# Patient Record
Sex: Female | Born: 1990 | Race: White | Hispanic: No | Marital: Single | Smoking: Never smoker
Health system: Southern US, Community
[De-identification: ages and names within clinical notes are randomized; demographics above are authoritative.]

## PROBLEM LIST (undated history)

## (undated) DIAGNOSIS — F88 Other disorders of psychological development: Secondary | ICD-10-CM

## (undated) DIAGNOSIS — F840 Autistic disorder: Secondary | ICD-10-CM

## (undated) DIAGNOSIS — Q909 Down syndrome, unspecified: Secondary | ICD-10-CM

## (undated) DIAGNOSIS — R482 Apraxia: Secondary | ICD-10-CM

## (undated) HISTORY — PX: MOUTH SURGERY: SHX715

---
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
## 2014-11-30 ENCOUNTER — Emergency Department (HOSPITAL_COMMUNITY)
Admission: EM | Admit: 2014-11-30 | Discharge: 2014-11-30 | Discharge status: Against Medical Advice | Payer: Medicaid Other

## 2014-11-30 ENCOUNTER — Encounter (HOSPITAL_COMMUNITY): Payer: Self-pay | Admitting: Emergency Medicine

## 2014-11-30 ENCOUNTER — Emergency Department (HOSPITAL_COMMUNITY)
Admission: EM | Admit: 2014-11-30 | Discharge: 2014-11-30 | Disposition: A | Discharge status: Nursing Home (Includes ICF) | Payer: Medicaid Other | Source: Home / Self Care

## 2014-11-30 DIAGNOSIS — R51 Headache: Secondary | ICD-10-CM

## 2014-11-30 DIAGNOSIS — R519 Headache, unspecified: Secondary | ICD-10-CM

## 2014-11-30 HISTORY — DX: Down syndrome, unspecified: Q90.9

## 2014-11-30 HISTORY — DX: Other disorders of psychological development: F88

## 2014-11-30 HISTORY — DX: Autistic disorder: F84.0

## 2014-11-30 HISTORY — DX: Apraxia: R48.2

## 2014-11-30 NOTE — ED Notes (Signed)
Mother of Pt to desk. Does not desire to wait. States that she will follow up with PCP. Pt sitting in waiting room, NAD, ambulatory

## 2014-11-30 NOTE — ED Notes (Signed)
Pt has Down's syndrome.  Her mother is with her today and states that for the last 2-3 weeks the pt has episodes of grabbing her head and covering her eyes and screaming, sometimes saying, "Ow."  Last night she was pointing at her head and saying, "Doctor, doctor".  The mother reports a loss of appetite and a withdrawal from her usual activities.  She is also reporting red bumps on her groin area that are opening with pus that dry up and reappear.  Mother denies any fevers. There is also a small red bump on her right eyelid.

## 2014-11-30 NOTE — ED Provider Notes (Signed)
CSN: [id number]     Arrival date & time 11/30/14  1445 History   None    Chief Complaint  Patient presents with  . Headache   (Consider location/radiation/quality/duration/timing/severity/associated sxs/prior Treatment)  HPI   Patient has been complaining of a HA for 2-3 weeks.  No history of migraines or headaches. Mom states it has increased in severity to the point where she screams and cries and points to her head and states "doctor".  Mom states this worries her as she does not like to go to the doctor and does not typically complain of pain.  Mom denies recent injury or fever.  Patient is unable to verbalize nausea, but mom denies vomiting. Reports patient has said that her stomach hurts.  She appears to be photophobic.  She is on oral birth control, but has been for "years" so no recent changes in hormonal therapy. Last dose of tylenol at 1400.  Past Medical History  Diagnosis Date  . Autism   . Down's syndrome   . Apraxia of speech   . Sensory integration disorder    Past Surgical History  Procedure Laterality Date  . Mouth surgery     History reviewed. No pertinent family history. History  Substance Use Topics  . Smoking status: Never Smoker   . Smokeless tobacco: Not on file  . Alcohol Use: No   OB History    No data available     Review of Systems  Constitutional: Negative.  Negative for fever and chills.  HENT: Negative for congestion, sinus pressure, sneezing and sore throat.   Eyes: Negative.   Respiratory: Negative.  Negative for cough, shortness of breath and wheezing.   Cardiovascular: Negative.   Gastrointestinal: Positive for abdominal pain. Negative for vomiting and diarrhea.       See HPI  Endocrine: Negative.   Genitourinary: Negative.   Skin: Positive for rash. Negative for pallor.       "bump on eyelid and vagina"  Allergic/Immunologic: Negative.  Negative for food allergies.  Neurological: Positive for headaches. Negative for speech difficulty  and weakness.  Hematological: Negative.   Psychiatric/Behavioral: Negative.     Allergies  Review of patient's allergies indicates no known allergies.  Home Medications   Prior to Admission medications   Medication Sig Start Date End Date Taking? Authorizing Provider  levonorgestrel-ethinyl estradiol (JOLESSA) 0.15-0.03 MG tablet Take 1 tablet by mouth daily.   Yes Historical Provider, MD   BP 123/77 mmHg  Pulse 96  Temp(Src) 98.6 F (37 C) (Oral)  Resp 16  SpO2 98%  LMP 10/30/2014 (Approximate)   Physical Exam  Constitutional: She appears well-developed and well-nourished. No distress.  HENT:  Head: Atraumatic.  Eyes: Conjunctivae are normal. Pupils are equal, round, and reactive to light. Right eye exhibits no discharge. Left eye exhibits no discharge. No scleral icterus.  Neck: Normal range of motion. Neck supple.  Cardiovascular: Normal rate, regular rhythm, normal heart sounds and intact distal pulses.  Exam reveals no gallop and no friction rub.   Pulmonary/Chest: Effort normal and breath sounds normal. No respiratory distress. She has no wheezes. She has no rales. She exhibits no tenderness.  Neurological: She is alert.  Unable to follow commands for neuro examination.  Skin: Skin is warm and dry. She is not diaphoretic.  Has a pustule on R eyelid. Few scattered pustules on mons pubis.   Nursing note and vitals reviewed.    Patient is mostly nonverbal and does not follow commands for full  neurological exam.   Mom is concerned about possible herpes, some ingrown pubic hairs noted on mons pubis.    No obvious S/S of systemic illness.  No nuchal rigidity.    ED Course  Procedures (including critical care time) Labs Review Labs Reviewed - No data to display  Imaging Review No results found.  Plan of care discussed with Dr. Randal Buba.  Due to patient's nonverbal status and inability to complete full neuro workup, patient referred to [facility] ED for further evaluation.      MDM   1. Acute intractable headache, unspecified headache type    Transfer to [facility] ED for further workup and evaluation.  Given her nonverbal status and no prior history of headaches, I recommend the patient have a head CT.   Servando Salina, NP 11/30/14 Flossie Buffy

## 2014-11-30 NOTE — ED Notes (Signed)
Pt being transferred to the ED via shuttle for further workup for her head pain.  Report was called to Freida Busman, RN First RN in the ED.

## 2014-12-02 ENCOUNTER — Emergency Department (HOSPITAL_COMMUNITY)
Admission: EM | Admit: 2014-12-02 | Discharge: 2014-12-02 | Disposition: A | Discharge status: Home / Self Care | Payer: Medicaid Other | Attending: Emergency Medicine | Admitting: Emergency Medicine

## 2014-12-02 ENCOUNTER — Encounter (HOSPITAL_COMMUNITY): Payer: Self-pay | Admitting: Emergency Medicine

## 2014-12-02 ENCOUNTER — Emergency Department (HOSPITAL_COMMUNITY): Payer: Medicaid Other

## 2014-12-02 DIAGNOSIS — Z79899 Other long term (current) drug therapy: Secondary | ICD-10-CM | POA: Insufficient documentation

## 2014-12-02 DIAGNOSIS — H6123 Impacted cerumen, bilateral: Secondary | ICD-10-CM | POA: Insufficient documentation

## 2014-12-02 DIAGNOSIS — F419 Anxiety disorder, unspecified: Secondary | ICD-10-CM | POA: Diagnosis not present

## 2014-12-02 DIAGNOSIS — R519 Headache, unspecified: Secondary | ICD-10-CM

## 2014-12-02 DIAGNOSIS — F840 Autistic disorder: Secondary | ICD-10-CM | POA: Insufficient documentation

## 2014-12-02 DIAGNOSIS — R51 Headache: Secondary | ICD-10-CM | POA: Diagnosis present

## 2014-12-02 LAB — CBC WITH DIFFERENTIAL/PLATELET
BASOS PCT: 1 % (ref 0–1)
Basophils Absolute: 0.1 10*3/uL (ref 0.0–0.1)
Eosinophils Absolute: 0.1 10*3/uL (ref 0.0–0.7)
Eosinophils Relative: 1 % (ref 0–5)
HCT: 41.2 % (ref 36.0–46.0)
Hemoglobin: 13.5 g/dL (ref 12.0–15.0)
Lymphocytes Relative: 29 % (ref 12–46)
Lymphs Abs: 2.4 10*3/uL (ref 0.7–4.0)
MCH: 32.1 pg (ref 26.0–34.0)
MCHC: 32.8 g/dL (ref 30.0–36.0)
MCV: 98.1 fL (ref 78.0–100.0)
MONO ABS: 0.3 10*3/uL (ref 0.1–1.0)
Monocytes Relative: 4 % (ref 3–12)
NEUTROS ABS: 5.2 10*3/uL (ref 1.7–7.7)
Neutrophils Relative %: 65 % (ref 43–77)
Platelets: 397 10*3/uL (ref 150–400)
RBC: 4.2 MIL/uL (ref 3.87–5.11)
RDW: 14 % (ref 11.5–15.5)
WBC: 8.1 10*3/uL (ref 4.0–10.5)

## 2014-12-02 LAB — BASIC METABOLIC PANEL
ANION GAP: 9 (ref 5–15)
BUN: 12 mg/dL (ref 6–20)
CHLORIDE: 107 mmol/L (ref 101–111)
CO2: 24 mmol/L (ref 22–32)
Calcium: 9 mg/dL (ref 8.9–10.3)
Creatinine, Ser: 0.8 mg/dL (ref 0.44–1.00)
GFR calc Af Amer: >60 mL/min (ref >60)
Glucose, Bld: 87 mg/dL (ref 65–99)
POTASSIUM: 3.9 mmol/L (ref 3.5–5.1)
SODIUM: 140 mmol/L (ref 135–145)

## 2014-12-02 LAB — I-STAT BETA HCG BLOOD, ED (MC, WL, AP ONLY): I-stat hCG, quantitative: <5 m[IU]/mL (ref <5)

## 2014-12-02 MED ORDER — METOCLOPRAMIDE HCL 5 MG/ML IJ SOLN
10.0000 mg | Freq: Once | INTRAMUSCULAR | Status: AC
  Administered 2014-12-02: 10 mg via INTRAVENOUS
  Filled 2014-12-02: qty 2

## 2014-12-02 MED ORDER — DIPHENHYDRAMINE HCL 50 MG/ML IJ SOLN
25.0000 mg | Freq: Once | INTRAMUSCULAR | Status: AC
  Administered 2014-12-02: 25 mg via INTRAVENOUS
  Filled 2014-12-02: qty 1

## 2014-12-02 MED ORDER — FENTANYL CITRATE (PF) 100 MCG/2ML IJ SOLN
50.0000 ug | Freq: Once | INTRAMUSCULAR | Status: AC
  Administered 2014-12-02: 50 ug via NASAL
  Filled 2014-12-02: qty 2

## 2014-12-02 MED ORDER — LORAZEPAM 2 MG/ML IJ SOLN
1.0000 mg | Freq: Once | INTRAMUSCULAR | Status: DC
  Filled 2014-12-02: qty 1

## 2014-12-02 MED ORDER — BUTALBITAL-APAP-CAFFEINE 50-325-40 MG PO TABS: 1.0000 | ORAL_TABLET | Freq: Three times a day (TID) | ORAL | Status: DC | PRN

## 2014-12-02 MED ORDER — PROMETHAZINE HCL 12.5 MG PO TABS: 12.5000 mg | ORAL_TABLET | Freq: Four times a day (QID) | ORAL | Status: DC | PRN

## 2014-12-02 MED ORDER — SODIUM CHLORIDE 0.9 % IV BOLUS (SEPSIS)
1000.0000 mL | Freq: Once | INTRAVENOUS | Status: AC
  Administered 2014-12-02: 1000 mL via INTRAVENOUS

## 2014-12-02 NOTE — ED Notes (Signed)
Mother at bedside reported that pt's has intermittent headache x3 weeks. Mother reported 2 days ago headache getting worse, lightheadedness and pt c/o bil eye pain. Denies n/v.

## 2014-12-02 NOTE — ED Provider Notes (Signed)
CSN: [id number]     Arrival date & time 12/02/14  1322 History   First MD Initiated Contact with Patient 12/02/14 1347     Chief Complaint  Patient presents with  . Headache     Patient is a 24 y.o. female presenting with headaches. The history is provided by a parent. No language interpreter was used.  Headache  Ariana Shepherd presents for evaluation of headache. Level V caveat due to developmental disorder. History is provided by the patient's mother. Ariana Shepherd has a history of autism, Down syndrome, apraxia of speech and sensory integration disorder. The mother states that Ariana Shepherd has been having headaches intermittently for the last several weeks. Headaches wax and wane but never resolved. At times she is holding her head and crying in pain. She was seen in urgent care yesterday for headache and was referred to the emergency department for further evaluation and they were unable to present for evaluation at that time. They present today because Ariana Shepherd headaches are worsening and not responding to Tylenol. No recent head injury. She does take birth control. She is a not a smoker. Her mother has a history of blood clots when she was on birth control. There is no family history of stroke or aneurysm.  Past Medical History  Diagnosis Date  . Autism   . Down's syndrome   . Apraxia of speech   . Sensory integration disorder    Past Surgical History  Procedure Laterality Date  . Mouth surgery     No family history on file. Social History  Substance Use Topics  . Smoking status: Never Smoker   . Smokeless tobacco: Not on file  . Alcohol Use: No   OB History    No data available     Review of Systems  Neurological: Positive for headaches.  All other systems reviewed and are negative.     Allergies  Review of patient's allergies indicates no known allergies.  Home Medications   Prior to Admission medications   Medication Sig Start Date End Date Taking? Authorizing Provider   levonorgestrel-ethinyl estradiol (JOLESSA) 0.15-0.03 MG tablet Take 1 tablet by mouth daily.    Historical Provider, MD   BP 116/58 mmHg  Pulse 108  Resp 20  SpO2 97%  LMP 10/30/2014 (Approximate) Physical Exam  Constitutional: She appears well-developed and well-nourished. She appears distressed.  HENT:  Head: Normocephalic and atraumatic.  Mouth/Throat: Oropharynx is clear and moist.  Bilateral ear canals with cerumen. No mastoid tenderness  Eyes: EOM are normal. Pupils are equal, round, and reactive to light.  Cardiovascular: Normal rate and regular rhythm.   No murmur heard. Pulmonary/Chest: Effort normal and breath sounds normal. No respiratory distress.  Abdominal: Soft. There is no tenderness. There is no rebound and no guarding.  Musculoskeletal: She exhibits no edema or tenderness.  Neurological: She is alert.  Nonverbal, photophobia. EOMI, 5/5 strength in all 4 extremities. No facial asymmetry.  Skin: Skin is warm and dry.  Psychiatric:  Anxious appearing  Nursing note and vitals reviewed.   ED Course  Procedures (including critical care time) Labs Review Labs Reviewed  BASIC METABOLIC PANEL  CBC WITH DIFFERENTIAL/PLATELET  I-STAT BETA HCG BLOOD, ED ([facility], [facility], [facility] ONLY)    Imaging Review Ct Head Wo Contrast  12/02/2014   CLINICAL DATA:  Headaches for 3 weeks.  EXAM: CT HEAD WITHOUT CONTRAST  TECHNIQUE: Contiguous axial images were obtained from the base of the skull through the vertex without intravenous contrast.  COMPARISON:  CT  scan of March 11, 2008.  FINDINGS: Bony calvarium appears intact. No mass effect or midline shift is noted. Ventricular size is within normal limits. There is no evidence of mass lesion, hemorrhage or acute infarction.  IMPRESSION: Normal head CT.   Electronically Signed   By: Lupita Raider, M.D.   On: 12/02/2014 14:36     EKG Interpretation None      MDM   Final diagnoses:  Bad headache    Patient here for evaluation of  headache for the last several weeks, history and examination is markedly limited due to developmental delay. On initial examination patient's clearly uncomfortable with photophobia. Treated with retinal followed by IV headache cocktail. On repeat evaluation following medications the patient is comfortable appearing and sitting in a chair next to the stretcher. She is looking around the room and interactive with her mother. History of presentation is not consistent with subarachnoid hemorrhage, cerebral aneurysm, dural sinus thrombosis. Discussed with mother importance of neurology follow-up as well as close return precautions for recurrent headache.    Tilden Fossa, MD 12/03/14 [phone]

## 2014-12-02 NOTE — ED Notes (Signed)
Patient transported to CT 

## 2014-12-02 NOTE — ED Notes (Signed)
Nurse drawing labs. 

## 2014-12-02 NOTE — ED Notes (Signed)
Per Marylee Floras RN, IV was taken out due to pt agitation, pt received approx 300 ml of bolus.

## 2014-12-02 NOTE — Discharge Instructions (Signed)

## 2015-10-17 ENCOUNTER — Observation Stay (HOSPITAL_COMMUNITY)
Admission: EM | Admit: 2015-10-17 | Discharge: 2015-10-20 | Disposition: A | Discharge status: Home / Self Care | Payer: Medicaid Other | Attending: Student in an Organized Health Care Education/Training Program | Admitting: Student in an Organized Health Care Education/Training Program

## 2015-10-17 ENCOUNTER — Encounter (HOSPITAL_COMMUNITY): Payer: Self-pay | Admitting: Emergency Medicine

## 2015-10-17 DIAGNOSIS — F840 Autistic disorder: Secondary | ICD-10-CM | POA: Insufficient documentation

## 2015-10-17 DIAGNOSIS — R51 Headache: Principal | ICD-10-CM | POA: Insufficient documentation

## 2015-10-17 DIAGNOSIS — Q211 Atrial septal defect: Secondary | ICD-10-CM | POA: Diagnosis not present

## 2015-10-17 DIAGNOSIS — R21 Rash and other nonspecific skin eruption: Secondary | ICD-10-CM | POA: Diagnosis not present

## 2015-10-17 DIAGNOSIS — F419 Anxiety disorder, unspecified: Secondary | ICD-10-CM | POA: Diagnosis not present

## 2015-10-17 DIAGNOSIS — R519 Headache, unspecified: Secondary | ICD-10-CM | POA: Insufficient documentation

## 2015-10-17 DIAGNOSIS — Q909 Down syndrome, unspecified: Secondary | ICD-10-CM | POA: Diagnosis not present

## 2015-10-17 DIAGNOSIS — K5909 Other constipation: Secondary | ICD-10-CM | POA: Insufficient documentation

## 2015-10-17 DIAGNOSIS — N390 Urinary tract infection, site not specified: Secondary | ICD-10-CM | POA: Insufficient documentation

## 2015-10-17 MED ORDER — POLYETHYLENE GLYCOL 3350 17 G PO PACK
17.0000 g | PACK | Freq: Two times a day (BID) | ORAL | Status: DC
  Administered 2015-10-17: 17 g via ORAL
  Filled 2015-10-17: qty 1

## 2015-10-17 MED ORDER — HALOPERIDOL 1 MG PO TABS
1.0000 mg | ORAL_TABLET | ORAL | Status: DC | PRN
  Filled 2015-10-17: qty 1

## 2015-10-17 MED ORDER — PROCHLORPERAZINE MALEATE 10 MG PO TABS
10.0000 mg | ORAL_TABLET | Freq: Once | ORAL | Status: AC
  Administered 2015-10-17: 10 mg via ORAL
  Filled 2015-10-17: qty 1

## 2015-10-17 MED ORDER — DIPHENHYDRAMINE HCL 25 MG PO CAPS
25.0000 mg | ORAL_CAPSULE | Freq: Once | ORAL | Status: AC
  Administered 2015-10-17: 25 mg via ORAL
  Filled 2015-10-17: qty 1

## 2015-10-17 MED ORDER — ACETAMINOPHEN 650 MG RE SUPP: 650.0000 mg | Freq: Four times a day (QID) | RECTAL | Status: DC | PRN

## 2015-10-17 MED ORDER — TRIAMCINOLONE ACETONIDE 0.025 % EX OINT: 1.0000 "application " | TOPICAL_OINTMENT | Freq: Two times a day (BID) | CUTANEOUS | Status: DC

## 2015-10-17 MED ORDER — ENOXAPARIN SODIUM 40 MG/0.4ML ~~LOC~~ SOLN
40.0000 mg | SUBCUTANEOUS | Status: DC
  Filled 2015-10-17: qty 0.4

## 2015-10-17 MED ORDER — HALOPERIDOL LACTATE 5 MG/ML IJ SOLN
1.0000 mg | INTRAMUSCULAR | Status: DC | PRN
Start: 2015-10-17 — End: 2015-10-17
  Administered 2015-10-17: 1 mg via INTRAMUSCULAR
  Filled 2015-10-17: qty 1

## 2015-10-17 MED ORDER — ACETAMINOPHEN 325 MG PO TABS: 650.0000 mg | ORAL_TABLET | Freq: Four times a day (QID) | ORAL | Status: DC | PRN

## 2015-10-17 MED ORDER — HALOPERIDOL LACTATE 5 MG/ML IJ SOLN: 1.0000 mg | INTRAMUSCULAR | Status: AC | PRN

## 2015-10-17 MED ORDER — ACETAMINOPHEN 500 MG PO TABS
1000.0000 mg | ORAL_TABLET | Freq: Three times a day (TID) | ORAL | Status: DC
  Administered 2015-10-17 – 2015-10-19 (×5): 1000 mg via ORAL
  Filled 2015-10-17 (×6): qty 2

## 2015-10-17 MED ORDER — HALOPERIDOL 2 MG PO TABS
2.0000 mg | ORAL_TABLET | ORAL | Status: AC | PRN
  Administered 2015-10-17: 2 mg via ORAL
  Filled 2015-10-17: qty 1

## 2015-10-17 MED ORDER — POLYETHYLENE GLYCOL 3350 17 G PO PACK: 17.0000 g | PACK | Freq: Every day | ORAL | Status: DC | PRN

## 2015-10-17 NOTE — H&P (Signed)
Date: 10/17/2015               Patient Name:  Ariana Shepherd MRN: [id number]  DOB: 02/02/1991 Age / Sex: 25 y.o., female   PCP: Eartha Inch, MD         Medical Service: Internal Medicine Teaching Service         Attending Physician: Dr. Tyson Alias, MD    First Contact: Dr. Earlene Plater Pager: [phone]  Second Contact: Dr. Vivianne Master Pager: [phone]       After Hours (After 5p/  First Contact Pager: [phone]  weekends / holidays): Second Contact Pager: [phone]   Chief Complaint: headache   History of Present Illness: Patient is a 25 year old female with a past medical history of autism, Down syndrome, apraxia of speech, sensory integration disorder being brought into the hospital by her mother who was concerned that patient has been complaining of headaches for the past 1 year which have been getting progressively worse. Mother states whenever patient gets a headache she "rocks her head and screams." and for the past 3 days patient has been getting headaches more frequently. She told her mother "I'm sick" and "doctor." States patient had 7 episodes of these headaches in the past 2 days. Mother states patient was quite functional about a year ago and did a lot of things on her own but for the past year she has changed - she has not been able to feed herself or do things on her own such as putting DVDs into the DVD player. States now she has to be prompted to pick up things or do things. States sometimes patient runs through the house and screams. Mother states she was concerned about the patient's symptoms so she sent her picture to an energy healing program to "pick up a frequency" and got a response saying patient might have encephalitis, meningitis, or a viral skin disorder. As such, she was worried and decided to bring the patient into the hospital for further evaluation. Mother believes patient has been having fevers for the past few days but she did not check the temperature. Also reports  patient having loss of appetite over the past year. Denies noticing any vomiting, however, does state that the patient is chronically constipated; last bowel movement was 2 days ago. She is also concerned about patient having an intermittent rash in her groin area and all over her body over the past 1 year. Denies any change in soap/lotion or detergent over the past year. Patient's last PCP visit was 6 months ago and mother states she has not been able to take her to doctors because she is scared. Patient is currently on an OCP. Mother also gives her Claritin for allergies and other supplements including magnesium, turmeric, omega oil, probiotic, and a homeopathic supplement named "rescue remedy."  In the ED, patient was found to be tachycardic. She was seen by neurology and they recommended getting an MRI of brain, MRA and MRV of the head.   Meds: Current Facility-Administered Medications  Medication Dose Route Frequency Provider Last Rate Last Dose  . acetaminophen (TYLENOL) tablet 1,000 mg  1,000 mg Oral Q8H John Giovanni, MD      . enoxaparin (LOVENOX) injection 40 mg  40 mg Subcutaneous Q24H John Giovanni, MD      . haloperidol (HALDOL) tablet 1 mg  1 mg Oral Q1H PRN Lora Paula, MD       Or  . haloperidol lactate (HALDOL) injection 1 mg  1 mg Intramuscular Q1H PRN Lora PaulaJennifer T Krall, MD      . polyethylene glycol (MIRALAX / GLYCOLAX) packet 17 g  17 g Oral Daily PRN John GiovanniVasundhra Elandra Powell, MD        Allergies: Allergies as of 10/17/2015 - Review Complete 10/17/2015  Allergen Reaction Noted  . Iodinated diagnostic agents Rash and Other (See Comments) 12/02/2014   Past Medical History  Diagnosis Date  . Autism   . Down's syndrome   . Apraxia of speech   . Sensory integration disorder    Past Surgical History  Procedure Laterality Date  . Mouth surgery     History reviewed. No pertinent family history. Social History   Social History  . Marital Status: Single    Spouse  Name: N/A  . Number of Children: N/A  . Years of Education: N/A   Occupational History  . Not on file.   Social History Main Topics  . Smoking status: Never Smoker   . Smokeless tobacco: Not on file  . Alcohol Use: No  . Drug Use: No  . Sexual Activity: Not on file   Other Topics Concern  . Not on file   Social History Narrative    Review of Systems: ROS  Pertinent positives are mentioned in history of present illness section. No further history could be obtained from the patient she is not able to communicate.  Physical Exam: Blood pressure 106/64, pulse 110, temperature 97.7 F (36.5 C), temperature source Oral, resp. rate 20, last menstrual period 10/10/2015, SpO2 98 %.  Physical exam was challenging due to lack of patient cooperation. Physical Exam  Constitutional: She appears well-developed and well-nourished.  Patient was lying on her abdomen. She did not answer questions or have any eye contact. Appeared anxious and was uncooperative with exam.  HENT:  Head: Atraumatic.  Cardiovascular: Intact distal pulses.   Tachycardic  Pulmonary/Chest: Effort normal and breath sounds normal. No respiratory distress. She has no wheezes. She has no rales.  Musculoskeletal: She exhibits no edema.  Neurological:  Awake and Alert  Moving all extremities   Skin: Skin is warm and dry.   Lab results: Basic Metabolic Panel: No results for input(s): NA, K, CL, CO2, GLUCOSE, BUN, CREATININE, CALCIUM, MG, PHOS in the last 72 hours. Liver Function Tests: No results for input(s): AST, ALT, ALKPHOS, BILITOT, PROT, ALBUMIN in the last 72 hours. No results for input(s): LIPASE, AMYLASE in the last 72 hours. No results for input(s): AMMONIA in the last 72 hours. CBC: No results for input(s): WBC, NEUTROABS, HGB, HCT, MCV, PLT in the last 72 hours. Cardiac Enzymes: No results for input(s): CKTOTAL, CKMB, CKMBINDEX, TROPONINI in the last 72 hours. BNP: No results for input(s): PROBNP in  the last 72 hours. D-Dimer: No results for input(s): DDIMER in the last 72 hours. CBG: No results for input(s): GLUCAP in the last 72 hours. Hemoglobin A1C: No results for input(s): HGBA1C in the last 72 hours. Fasting Lipid Panel: No results for input(s): CHOL, HDL, LDLCALC, TRIG, CHOLHDL, LDLDIRECT in the last 72 hours. Thyroid Function Tests: No results for input(s): TSH, T4TOTAL, FREET4, T3FREE, THYROIDAB in the last 72 hours. Anemia Panel: No results for input(s): VITAMINB12, FOLATE, FERRITIN, TIBC, IRON, RETICCTPCT in the last 72 hours. Coagulation: No results for input(s): LABPROT, INR in the last 72 hours. Urine Drug Screen: Drugs of Abuse  No results found for: LABOPIA, COCAINSCRNUR, LABBENZ, AMPHETMU, THCU, LABBARB  Alcohol Level: No results for input(s): ETH in the last 72  hours. Urinalysis: No results for input(s): COLORURINE, LABSPEC, PHURINE, GLUCOSEU, HGBUR, BILIRUBINUR, KETONESUR, PROTEINUR, UROBILINOGEN, NITRITE, LEUKOCYTESUR in the last 72 hours.  Invalid input(s): APPERANCEUR Misc. Labs:   Imaging results:  No results found.  Other results: EKG:   Assessment & Plan by Problem: Active Problems:   Headache  Headache  Patient is presenting with a 1 year history of intermittent headaches and cognitive decline. Her headaches has been worse for the past 3 days. At baseline patient has apraxic speech and sensory integration disorder. Differentials for headaches include migraine headaches, cluster headaches, tension headaches. However, since patient is on an OCP, cerebral venous thrombosis needs to be ruled out. Patient has already been seen and evaluated by neurology in the ED. -Admit to tele -Appreciate neurology recs -Haldol prn agitation -Prochlorperazine 10 mg once for headache -Diphenhydramine 25 mg once for headache  -Tylenol 1000 mg q8  -Pending MRI brain with and without contrast, MRA head, and MRV -Pending labs: CBC, CMP, TSH, RPR, B12, folate, HIV  antibody  -EKG pending   Chronic constipation -Miralax prn   DVT ppx: Lovenox  Diet: Regular  Code: Full   Dispo: Disposition is deferred at this time, awaiting improvement of current medical problems. Anticipated discharge in approximately 1-2 day(s).   The patient does have a current PCP Eartha Inch(Michael C Badger, MD) and [facility] after discharge.  The patient does not have transportation limitations that hinder transportation to clinic appointments.  Signed: John GiovanniVasundhra Tywan Siever, MD 10/17/2015, 8:30 PM

## 2015-10-17 NOTE — Consult Note (Signed)
NEURO HOSPITALIST CONSULT NOTE   Requestig physician: Dr. Adela LankFloyd   Reason for Consult:Headache   History obtained from:  Chart and Patient's Mother  HPI:                                                                                                                                          Ariana Shepherd is an 25 y.o. female with intermittent headaches for the last 6 months and developed a severe headache this morning.  Her mother states she has had a cognitive decline over the previous 6mths and is no longer consistently able to dress herself, feed herself, but is still able to ambulate.  Prior to the headaches beginning she was able to dress and feed herself.  At baseline she has apraxic speech and sensory integration disorder.  Past Medical History  Diagnosis Date  . Autism   . Down's syndrome   . Apraxia of speech   . Sensory integration disorder     Past Surgical History  Procedure Laterality Date  . Mouth surgery      History reviewed. No pertinent family history.  Family History: Mother Hx of PE   Social History:  reports that she has never smoked. She does not have any smokeless tobacco history on file. She reports that she does not drink alcohol or use illicit drugs.  Allergies  Allergen Reactions  . Iodinated Diagnostic Agents Rash and Other (See Comments)    Upset stomach    MEDICATIONS:                                                                                                                     No current facility-administered medications for this encounter.   Current Outpatient Prescriptions  Medication Sig Dispense Refill  . acetaminophen (TYLENOL) 500 MG tablet Take 500 mg by mouth every 6 (six) hours as needed for mild pain, moderate pain or headache.     . ibuprofen (ADVIL,MOTRIN) 200 MG tablet Take 200 mg by mouth every 6 (six) hours as needed for headache or moderate pain.    Marland Kitchen. levonorgestrel-ethinyl estradiol (JOLESSA)  0.15-0.03 MG tablet Take 1 tablet by mouth daily.    Marland Kitchen. MAGNESIUM PO Take 2 capsules by mouth daily.    . Omega-3 Fatty Acids (OMEGA  3 PO) Take 1 capsule by mouth daily.    Marland Kitchen. OVER THE COUNTER MEDICATION Take 4 drops by mouth 4 (four) times daily as needed (anxiety).     . Probiotic CAPS Take 1 capsule by mouth daily.    . TURMERIC PO Take 1 capsule by mouth daily.    Marland Kitchen. triamcinolone (KENALOG) 0.025 % ointment Apply 1 application topically 2 (two) times daily. 30 g 0     ROS:                                                                                                                                       History obtained from chart review  General ROS: negative for - chills, fatigue, fever, night sweats, weight gain or weight loss Psychological ROS: negative for - behavioral disorder, hallucinations, memory difficulties, mood swings or suicidal ideation Ophthalmic ROS: negative for - blurry vision, double vision, eye pain or loss of vision ENT ROS: negative for - epistaxis, nasal discharge, oral lesions, sore throat, tinnitus or vertigo Allergy and Immunology ROS: negative for - hives or itchy/watery eyes Hematological and Lymphatic ROS: negative for - bleeding problems, bruising or swollen lymph nodes Endocrine ROS: negative for - galactorrhea, hair pattern changes, polydipsia/polyuria or temperature intolerance Respiratory ROS: negative for - cough, hemoptysis, shortness of breath or wheezing Cardiovascular ROS: negative for - chest pain, dyspnea on exertion, edema or irregular heartbeat Gastrointestinal ROS: negative for - abdominal pain, diarrhea, hematemesis, nausea/vomiting or stool incontinence Genito-Urinary ROS: negative for - dysuria, hematuria, incontinence or urinary frequency/urgency Musculoskeletal ROS: negative for - joint swelling or muscular weakness Neurological ROS: as noted in HPI Dermatological ROS: negative for rash and skin lesion changes   Blood pressure 111/70,  pulse 113, temperature 97.7 F (36.5 C), temperature source Oral, resp. rate 22, last menstrual period 10/10/2015, SpO2 100 %.   Neurologic Examination:                                                                                                      HEENT-  Normocephalic, no lesions, without obvious abnormality.  Normal external eye and conjunctiva.  Normal TM's bilaterally.  Normal auditory canals and external ears. Normal external nose, mucus membranes and septum.  Normal pharynx. Cardiovascular- regular rate and rhythm, S1, S2 normal, no murmur, click, rub or gallop, pulses palpable throughout   Lungs- chest clear, no wheezing, rales, normal symmetric air entry, Heart exam - S1, S2 normal, no murmur, no gallop, rate regular Abdomen- soft,  non-tender; bowel sounds normal; no masses,  no organomegaly   Neurological Examination Mental Status: Alert, non verbal Able to track Does not follow command Moves all extremities Non focal Normal tone in all extremities       Lab Results: Basic Metabolic Panel: No results for input(s): NA, K, CL, CO2, GLUCOSE, BUN, CREATININE, CALCIUM, MG, PHOS in the last 168 hours.  Liver Function Tests: No results for input(s): AST, ALT, ALKPHOS, BILITOT, PROT, ALBUMIN in the last 168 hours. No results for input(s): LIPASE, AMYLASE in the last 168 hours. No results for input(s): AMMONIA in the last 168 hours.  CBC: No results for input(s): WBC, NEUTROABS, HGB, HCT, MCV, PLT in the last 168 hours.  Cardiac Enzymes: No results for input(s): CKTOTAL, CKMB, CKMBINDEX, TROPONINI in the last 168 hours.  Lipid Panel: No results for input(s): CHOL, TRIG, HDL, CHOLHDL, VLDL, LDLCALC in the last 168 hours.  CBG: No results for input(s): GLUCAP in the last 168 hours.  Microbiology: No results found for this or any previous visit.  Coagulation Studies: No results for input(s): LABPROT, INR in the last 72 hours.  Imaging: No results  found.         10/17/2015, 6:03 PM   Assessment/Plan:  25 y.o. female with intermittent headaches for the last 6 months and developed a severe headache this morning.  Her mother states she has had a cognitive decline over the previous and is no longer consistently able to dress herself, feed herself, but is still able to ambulate.  Prior to the headaches beginning she was able to dress and feed herself.  At baseline she has apraxic speech and sensory integration disorder.  - Admit to hospital - Will need MRI brain with and without contrast, MRA head and MRV to rule out SVT - please get B12, folate, TSH and RPR

## 2015-10-17 NOTE — ED Notes (Signed)
MD at bedside. 

## 2015-10-17 NOTE — ED Provider Notes (Signed)
CSN: [id number]     Arrival date & time 10/17/15  1212 History   First MD Initiated Contact with Patient 10/17/15 1306     Chief Complaint  Patient presents with  . Headache  . Altered Mental Status     (Consider location/radiation/quality/duration/timing/severity/associated sxs/prior Treatment) HPI Comments: Pt comes in with cc of headaches. Pt has hx of down's syndrome and autism. Mother reports that patient has been complaining of headache for the past several months. Pt has overtime regressed with her functional status, now she no longer is having drinking fluids, eating food, playing DVD etc. That shee used to do. The patient occasionally will drink fluid on her own. No outpatient evaluation done. This morning, patient woke up and was holding her head and crying. She uttered per mother "doctor" "scared" "dead". Mother got concerned and used some "agency scan" and the report recommended emergent evaluation for encephalitis, meningitis. Pt also has some skin rash in groin.      Patient is a 25 y.o. female presenting with headaches and altered mental status. The history is provided by a caregiver. The history is limited by a language barrier and a developmental delay. No language interpreter was used.  Headache Altered Mental Status Associated symptoms: headaches     Past Medical History  Diagnosis Date  . Autism   . Down's syndrome   . Apraxia of speech   . Sensory integration disorder    Past Surgical History  Procedure Laterality Date  . Mouth surgery     History reviewed. No pertinent family history. Social History  Substance Use Topics  . Smoking status: Never Smoker   . Smokeless tobacco: None  . Alcohol Use: No   OB History    No data available     Review of Systems  Neurological: Positive for headaches.      Allergies  Iodinated diagnostic agents  Home Medications   Prior to Admission medications   Medication Sig Start Date End Date Taking?  Authorizing Provider  acetaminophen (TYLENOL) 500 MG tablet Take 500 mg by mouth every 6 (six) hours as needed for mild pain, moderate pain or headache.    Yes Historical Provider, MD  ibuprofen (ADVIL,MOTRIN) 200 MG tablet Take 200 mg by mouth every 6 (six) hours as needed for headache or moderate pain.   Yes Historical Provider, MD  levonorgestrel-ethinyl estradiol (JOLESSA) 0.15-0.03 MG tablet Take 1 tablet by mouth daily.   Yes Historical Provider, MD  MAGNESIUM PO Take 2 capsules by mouth daily.   Yes Historical Provider, MD  Omega-3 Fatty Acids (OMEGA 3 PO) Take 1 capsule by mouth daily.   Yes Historical Provider, MD  OVER THE COUNTER MEDICATION Take 4 drops by mouth 4 (four) times daily as needed (anxiety).    Yes Historical Provider, MD  Probiotic CAPS Take 1 capsule by mouth daily.   Yes Historical Provider, MD  TURMERIC PO Take 1 capsule by mouth daily.   Yes Historical Provider, MD   BP 114/86 mmHg  Pulse 90  Temp(Src) 97.7 F (36.5 C) (Oral)  Resp 18  SpO2 100%  LMP 10/10/2015 Physical Exam  Constitutional: She is oriented to person, place, and time. She appears well-developed.  HENT:  Head: Atraumatic.  Eyes: EOM are normal.  Neck: Normal range of motion. Neck supple.  Cardiovascular: Normal rate.   Pulmonary/Chest: Effort normal.  Abdominal: Bowel sounds are normal.  Neurological: She is alert and oriented to person, place, and time.  Skin: Skin is warm and  dry.  Nursing note and vitals reviewed.   ED Course  Procedures (including critical care time) Labs Review Labs Reviewed - No data to display  Imaging Review No results found. I have personally reviewed and evaluated these images and lab results as part of my medical decision-making.   EKG Interpretation None      MDM   Final diagnoses:  Intractable headache, unspecified chronicity pattern, unspecified headache type    Pt comes in w. Cc of headache.  DDX includes: Primary headaches - including  migrainous headaches, cluster headaches, tension headaches. Cavernous sinus thrombosis / dural thrombosis - pt is on birth control with estrogen Meningitis / Encephalitis Tumor Vascular headaches AV malformation Brain aneurysm Muscular headaches  A/P: Pt comes in with cc of headaches. No concerns for life threatening secondary headaches because she has no focal neuro deficits that are new and there is no meningeal signs or fevers.  This is a tough social case. Pt has DOWNs, Autism - and outpatient f/u has been a challenge. Mother really concerned as patient volunteered to see a doctor and uttered scared and dead today. Additionally, the app they used suggested life threatening conditions. Biggest concern from my side would be thrombosis - given she is on estrogen.  I informed the patient that clinical concerns for meningitis are low. There has been functional decline, but it is not new, and all her symptoms have chronically worsened.  We decided to call Neuro - Dr. Zigmund DanielShikman recommends [facility] and he will assess her for MRI w/ and w/o. He doesn't think LP is needed, but he will assess patient. Mother aware that MRI in the ER will be done only if recommended by Neuro to be done emergently, and she is OK with that -she will consider having her pcp order outpatient MRI if she is made aware of the order.  Derwood KaplanAnkit Zykeria Laguardia, MD 10/17/15 1545

## 2015-10-17 NOTE — ED Notes (Addendum)
[place] Charge RN made aware patient is in transport to [facility] ED.

## 2015-10-17 NOTE — ED Provider Notes (Signed)
  Physical Exam  BP 120/81 mmHg  Pulse 109  Temp(Src) 97.7 F (36.5 C) (Oral)  Resp 22  SpO2 99%  LMP 10/10/2015  Physical Exam  ED Course  Procedures  MDM 25 yo F [facility] with worsening headaches. Mom is concerned that she may have some sort of serious finding. Patient is mentally disabled. Sent here for neurology evaluation.     Neuro recommends admission.   The patients results and plan were reviewed and discussed.   Any x-rays performed were independently reviewed by myself.   Differential diagnosis were considered with the presenting HPI.  Medications  haloperidol (HALDOL) tablet 1 mg (not administered)    Or  haloperidol lactate (HALDOL) injection 1 mg (not administered)  enoxaparin (LOVENOX) injection 40 mg (not administered)  polyethylene glycol (MIRALAX / GLYCOLAX) packet 17 g (not administered)  acetaminophen (TYLENOL) tablet 1,000 mg (not administered)    Filed Vitals:   10/17/15 1557 10/17/15 1722 10/17/15 1746 10/17/15 1830  BP: 114/83 120/81 111/70 106/64  Pulse: 98 109 113 110  Temp:      TempSrc:      Resp: 18 22 22 20   SpO2: 100% 99% 100% 98%    Final diagnoses:  Intractable headache, unspecified chronicity pattern, unspecified headache type    Admission/ observation were discussed with the admitting physician, patient and/or family and they are comfortable with the plan.    Melene Planan Jaheem Hedgepath, DO 10/17/15 2033

## 2015-10-17 NOTE — ED Notes (Addendum)
Pt with hx of down syndrome c/o severe headaches, holding her head, crying, asking for doctor and acting withdrawn. Pt has episodes of same in the past which have been evaluated, this episode onset Friday, significantly worse than past episodes. No new symptoms today. Pt has intermittent groin rash and skin rash to arms.   Family wants to mention that they had an agency scan a normal polaroid photograph of the patient's body and they found requencies similar to encephalitis, viral skin disorder.

## 2015-10-17 NOTE — ED Notes (Signed)
Pt mother refusing IV access for pt, states "she will not tolerate that at all without being sedated." EDP aware and okay for pt to not have IV access.

## 2015-10-17 NOTE — Progress Notes (Addendum)
Pt is very agitated and anxious. She will not stay in bed. She is combative towards mother and staff. 1 mg of Haldol IM was given at 2037 with no success. Spoke to Dr. Loney Lohathore again who stated that she would put a new order in. She also spoke to pt mother via phone. Will continue to monitor.   Pt received 2 mg of Haldol PO at 2250. Will continue to monitor

## 2015-10-18 DIAGNOSIS — R51 Headache: Secondary | ICD-10-CM | POA: Diagnosis not present

## 2015-10-18 DIAGNOSIS — F419 Anxiety disorder, unspecified: Secondary | ICD-10-CM | POA: Diagnosis not present

## 2015-10-18 DIAGNOSIS — K5909 Other constipation: Secondary | ICD-10-CM

## 2015-10-18 LAB — COMPREHENSIVE METABOLIC PANEL
ALBUMIN: 3.3 g/dL — AB (ref 3.5–5.0)
ALK PHOS: 51 U/L (ref 38–126)
ALT: 17 U/L (ref 14–54)
ANION GAP: 8 (ref 5–15)
AST: 31 U/L (ref 15–41)
BILIRUBIN TOTAL: 0.9 mg/dL (ref 0.3–1.2)
BUN: 9 mg/dL (ref 6–20)
CO2: 24 mmol/L (ref 22–32)
Calcium: 9.4 mg/dL (ref 8.9–10.3)
Chloride: 107 mmol/L (ref 101–111)
Creatinine, Ser: 0.66 mg/dL (ref 0.44–1.00)
GFR calc Af Amer: >60 mL/min (ref >60)
GFR calc non Af Amer: >60 mL/min (ref >60)
GLUCOSE: 79 mg/dL (ref 65–99)
POTASSIUM: 4.9 mmol/L (ref 3.5–5.1)
Sodium: 139 mmol/L (ref 135–145)
TOTAL PROTEIN: 6.5 g/dL (ref 6.5–8.1)

## 2015-10-18 LAB — CBC
HEMATOCRIT: 39.4 % (ref 36.0–46.0)
HEMOGLOBIN: 13 g/dL (ref 12.0–15.0)
MCH: 31.9 pg (ref 26.0–34.0)
MCHC: 33 g/dL (ref 30.0–36.0)
MCV: 96.8 fL (ref 78.0–100.0)
Platelets: 373 10*3/uL (ref 150–400)
RBC: 4.07 MIL/uL (ref 3.87–5.11)
RDW: 13.5 % (ref 11.5–15.5)
WBC: 7.5 10*3/uL (ref 4.0–10.5)

## 2015-10-18 LAB — T4, FREE: FREE T4: 0.74 ng/dL (ref 0.61–1.12)

## 2015-10-18 LAB — VITAMIN B12: Vitamin B-12: 217 pg/mL (ref 180–914)

## 2015-10-18 LAB — TSH: TSH: 6.537 u[IU]/mL — AB (ref 0.350–4.500)

## 2015-10-18 LAB — HIV ANTIBODY (ROUTINE TESTING W REFLEX): HIV SCREEN 4TH GENERATION: NONREACTIVE

## 2015-10-18 LAB — FOLATE: FOLATE: 30.3 ng/mL (ref >5.9)

## 2015-10-18 LAB — RPR: RPR Ser Ql: NONREACTIVE

## 2015-10-18 LAB — GLUCOSE, CAPILLARY: GLUCOSE-CAPILLARY: 77 mg/dL (ref 65–99)

## 2015-10-18 MED ORDER — DIPHENHYDRAMINE HCL 50 MG/ML IJ SOLN
12.5000 mg | Freq: Once | INTRAMUSCULAR | Status: AC
  Administered 2015-10-18: 12.5 mg via INTRAVENOUS
  Filled 2015-10-18: qty 1

## 2015-10-18 MED ORDER — POLYETHYLENE GLYCOL 3350 17 G PO PACK: 17.0000 g | PACK | Freq: Every day | ORAL | Status: DC | PRN

## 2015-10-18 MED ORDER — PROCHLORPERAZINE EDISYLATE 5 MG/ML IJ SOLN
10.0000 mg | Freq: Once | INTRAMUSCULAR | Status: AC
  Administered 2015-10-18: 10 mg via INTRAVENOUS
  Filled 2015-10-18: qty 2

## 2015-10-18 MED ORDER — LORAZEPAM 1 MG PO TABS
1.0000 mg | ORAL_TABLET | Freq: Once | ORAL | Status: AC
  Administered 2015-10-18: 1 mg via ORAL

## 2015-10-18 MED ORDER — LORAZEPAM 2 MG/ML IJ SOLN
1.0000 mg | Freq: Once | INTRAMUSCULAR | Status: DC
  Filled 2015-10-18: qty 1

## 2015-10-18 MED ORDER — RAMELTEON 8 MG PO TABS
8.0000 mg | ORAL_TABLET | Freq: Once | ORAL | Status: AC
  Administered 2015-10-18: 8 mg via ORAL
  Filled 2015-10-18 (×2): qty 1

## 2015-10-18 MED ORDER — LORAZEPAM 1 MG PO TABS
1.0000 mg | ORAL_TABLET | Freq: Four times a day (QID) | ORAL | Status: DC | PRN
  Administered 2015-10-18: 1 mg via ORAL
  Filled 2015-10-18 (×3): qty 1

## 2015-10-18 MED ORDER — LORAZEPAM 1 MG PO TABS
1.0000 mg | ORAL_TABLET | Freq: Once | ORAL | Status: DC
  Administered 2015-10-18: 1 mg via ORAL
  Filled 2015-10-18: qty 1

## 2015-10-18 MED ORDER — KETOROLAC TROMETHAMINE 15 MG/ML IJ SOLN
15.0000 mg | Freq: Once | INTRAMUSCULAR | Status: AC
  Administered 2015-10-18: 15 mg via INTRAVENOUS
  Filled 2015-10-18: qty 1

## 2015-10-18 NOTE — Progress Notes (Signed)
Pt's mother is requesting that the pt have something to clam her down. Per mother pt keeps taking off mitts and trying to pull her IV out. I spoke with internal medicine who states to give the PRN ativan right now despite the time frame. Will continue to monitor

## 2015-10-18 NOTE — Progress Notes (Signed)
Subjective: Patient admitted overnight to IMTS due to intermittent headaches for last several months that has worsened over the past few days. She was sleeping this morning and so we let her rest.  We briefly spoke with her mother who was adamant that her daughter would not be able to tolerate MRI without anesthesia.  Returned early this afternoon and patient is sitting up in chair with mother at bedside.  She is not participatory with exam or answering questions.  Objective: Vital signs in last 24 hours: Filed Vitals:   10/17/15 1722 10/17/15 1746 10/17/15 1830 10/18/15 0628  BP: 120/81 111/70 106/64 118/73  Pulse: 109 113 110 136  Temp:    97.4 F (36.3 C)  TempSrc:    Axillary  Resp: 22 22 20 18   SpO2: 99% 100% 98% 97%   Weight change:   Intake/Output Summary (Last 24 hours) at 10/18/15 1100 Last data filed at 10/18/15 [id number]  Gross per 24 hour  Intake    240 ml  Output    300 ml  Net    -60 ml   General: sitting up in chair, anxious HEENT: [place]/AT Extremities: soft mittens on, moving extremities  Neuro: awake and alert  Lab Results: Basic Metabolic Panel:  Recent Labs Lab 10/18/15 0625  NA 139  K 4.9  CL 107  CO2 24  GLUCOSE 79  BUN 9  CREATININE 0.66  CALCIUM 9.4   Liver Function Tests:  Recent Labs Lab 10/18/15 0625  AST 31  ALT 17  ALKPHOS 51  BILITOT 0.9  PROT 6.5  ALBUMIN 3.3*   CBC:  Recent Labs Lab 10/18/15 0625  WBC 7.5  HGB 13.0  HCT 39.4  MCV 96.8  PLT 373   CBG:  Recent Labs Lab 10/18/15 0959  GLUCAP 77   Thyroid Function Tests:  Recent Labs Lab 10/18/15 0625  TSH 6.537*   Anemia Panel:  Recent Labs Lab 10/18/15 0625  VITAMINB12 217  FOLATE 30.3   Micro Results: No results found for this or any previous visit (from the past 240 hour(s)). Studies/Results: No results found. Medications: I have reviewed the patient's current medications. Scheduled Meds: . acetaminophen  1,000 mg Oral Q8H  . enoxaparin  (LOVENOX) injection  40 mg Subcutaneous Q24H   Continuous Infusions:  PRN Meds:.polyethylene glycol Assessment/Plan: Active Problems:   Headache   Cephalalgia   Autism   Down's syndrome  Headache: patient coming in with 1 year history of headaches and cognitive decline per her mother.  She was evaluated yesterday in the ED by neurology who recommended obtaining MRI, MRA, and MRV as patient on OCP with sinus venous thrombosis. - neurology input appreciated - MRI, MRA, and MRV pending.  Discussed with radiology department for scheduling with anesthesia and she is on the schedule for tomorrow at noon. - CBC normal - CMP normal except for mild hypoabluminemia - B12 borderline, Folate normal - TSH mildly elevated - HIV and RPR pending - will add Ativan 1mg  PO as needed Q6H for anxiety and agitation - if needed, can consider Haldol  Chronic constipation -Miralax prn   DVT ppx: Lovenox  Diet: Regular, NPO at MN for MRI   Code: Full   Dispo: Disposition is deferred at this time, awaiting improvement of current medical problems.  Anticipated discharge in approximately 2-3 day(s).   The patient does have a current PCP Eartha Inch(Michael C Badger, MD) and [facility] after discharge.  The patient does not  have transportation limitations that hinder transportation to clinic appointments.  .Services Needed at time of discharge: Y = Yes, Blank = No PT:   OT:   RN:   Equipment:   Other:       Gwynn BurlyAndrew Estefania Kamiya, DO 10/18/2015, 11:00 AM

## 2015-10-18 NOTE — Progress Notes (Signed)
Patient/Family oriented to room. Information packet given to patient/family. Admission inpatient armband information verified with patient/family to include name and date of birth and placed on patient arm. Side rails up x 2, fall assessment and education completed with patient/family. Call light within reach. Patient/family able to voice and demonstrate understanding of unit orientation instructions  Pt has down syndrome and is agitated and anxious. She doesn't want anyone to touch her but her mother. Vitals and cardiac monitoring cannot take place at this moment. Will give her something to relax her and try again. Will continue to monitor.

## 2015-10-18 NOTE — Progress Notes (Signed)
Checked on patient again. She continues to appear anxious and is hugging her mother. As per mother, patient has not slept and has been siting in the bed playing. They tried turning off the lights and TV but it did not help. When asked about headache or pain anywhere else, patient did not answer. She was noted to be ambulating to the bathroom without any difficulty. Physical exam and vitals could not be done as patient became very anxious and started shaking anytime someone other than her mother tried to approach her.  -Oral Ativan 1 mg once for anxiety

## 2015-10-18 NOTE — Progress Notes (Signed)
Went in to give pt ativan per MD orders. Pt's mother is upset stating that this will not put her daughter to sleep and it hasn't been helpful with her agitation. She is requesting to speak with the doctor and asking for something stronger. She does not want haldol or the ativan because she states it has not been working all day. Will continue to monitor.

## 2015-10-18 NOTE — Progress Notes (Signed)
Pt had periods of agitation, wanting to pull off mitts and pull at IV. Pt also wanting to stay in bathroom, sitting on floor. This may be r/t change in environment. Pt's mother concerned about this and wanting something to help calm/sedate Pt. MD notified, Ativan 1 mg q 6 hrs ordered and given. About an hour later, no change. Pt's mother still concerned. Ativan 1 mg once given. Pt reassessed, found sitting calmly in bathroom floor. Mother's main concern is that this doesn't go on through the night, she would like Pt to go to bed. MD aware, no new orders. Will continue to monitor.

## 2015-10-18 NOTE — Progress Notes (Signed)
Md ordered pt to receive ativan PO, but pt will not get up in order to take it. Mother states that she will call out when the pt gets up again. Will continue to monitor

## 2015-10-19 ENCOUNTER — Observation Stay (HOSPITAL_COMMUNITY): Payer: Medicaid Other | Admitting: Certified Registered Nurse Anesthetist

## 2015-10-19 ENCOUNTER — Encounter (HOSPITAL_COMMUNITY)
Admission: EM | Disposition: A | Discharge status: Home / Self Care | Payer: Self-pay | Source: Home / Self Care | Attending: Emergency Medicine

## 2015-10-19 ENCOUNTER — Observation Stay (HOSPITAL_COMMUNITY): Payer: Medicaid Other

## 2015-10-19 ENCOUNTER — Encounter (HOSPITAL_COMMUNITY): Payer: Self-pay | Admitting: Anesthesiology

## 2015-10-19 DIAGNOSIS — N390 Urinary tract infection, site not specified: Secondary | ICD-10-CM | POA: Diagnosis not present

## 2015-10-19 DIAGNOSIS — F840 Autistic disorder: Secondary | ICD-10-CM | POA: Diagnosis not present

## 2015-10-19 DIAGNOSIS — R51 Headache: Secondary | ICD-10-CM | POA: Diagnosis not present

## 2015-10-19 DIAGNOSIS — K5909 Other constipation: Secondary | ICD-10-CM | POA: Diagnosis not present

## 2015-10-19 DIAGNOSIS — Q909 Down syndrome, unspecified: Secondary | ICD-10-CM | POA: Diagnosis not present

## 2015-10-19 HISTORY — PX: RADIOLOGY WITH ANESTHESIA: SHX6223

## 2015-10-19 LAB — URINALYSIS, ROUTINE W REFLEX MICROSCOPIC
Bilirubin Urine: NEGATIVE
GLUCOSE, UA: NEGATIVE mg/dL
Hgb urine dipstick: NEGATIVE
Ketones, ur: NEGATIVE mg/dL
NITRITE: NEGATIVE
PH: 5.5 (ref 5.0–8.0)
PROTEIN: NEGATIVE mg/dL
Specific Gravity, Urine: 1.027 (ref 1.005–1.030)

## 2015-10-19 LAB — URINE MICROSCOPIC-ADD ON

## 2015-10-19 SURGERY — RADIOLOGY WITH ANESTHESIA
Anesthesia: General

## 2015-10-19 MED ORDER — PROPOFOL 10 MG/ML IV BOLUS
INTRAVENOUS | Status: DC | PRN
  Administered 2015-10-19: 200 mg via INTRAVENOUS

## 2015-10-19 MED ORDER — FENTANYL CITRATE (PF) 250 MCG/5ML IJ SOLN
INTRAMUSCULAR | Status: DC | PRN
  Administered 2015-10-19: 50 ug via INTRAVENOUS

## 2015-10-19 MED ORDER — GADOBENATE DIMEGLUMINE 529 MG/ML IV SOLN
13.0000 mL | Freq: Once | INTRAVENOUS | Status: AC | PRN
  Administered 2015-10-19: 13 mL via INTRAVENOUS

## 2015-10-19 MED ORDER — MIDAZOLAM HCL 2 MG/ML PO SYRP
20.0000 mg | ORAL_SOLUTION | Freq: Once | ORAL | Status: AC
  Administered 2015-10-19: 15 mg via ORAL

## 2015-10-19 MED ORDER — MIDAZOLAM HCL 2 MG/ML PO SYRP
ORAL_SOLUTION | ORAL | Status: AC
  Filled 2015-10-19: qty 8

## 2015-10-19 MED ORDER — LACTATED RINGERS IV SOLN
INTRAVENOUS | Status: DC | PRN
  Administered 2015-10-19: 13:00:00 via INTRAVENOUS

## 2015-10-19 MED ORDER — CLONAZEPAM 0.5 MG PO TABS
0.2500 mg | ORAL_TABLET | Freq: Once | ORAL | Status: AC
  Administered 2015-10-19: 0.25 mg via ORAL
  Filled 2015-10-19: qty 1

## 2015-10-19 MED ORDER — MIDAZOLAM HCL 2 MG/2ML IJ SOLN
INTRAMUSCULAR | Status: DC | PRN
  Administered 2015-10-19: 2 mg via INTRAVENOUS

## 2015-10-19 MED ORDER — RAMELTEON 8 MG PO TABS
8.0000 mg | ORAL_TABLET | Freq: Every day | ORAL | Status: DC
  Administered 2015-10-19: 8 mg via ORAL
  Filled 2015-10-19 (×2): qty 1

## 2015-10-19 NOTE — Anesthesia Preprocedure Evaluation (Addendum)
Anesthesia Evaluation  Patient identified by MRN, date of birth, ID band Patient awake    Reviewed: Allergy & Precautions, H&P , NPO status , Patient's Chart, lab work & pertinent test results  History of Anesthesia Complications (+) history of anesthetic complications  Airway Mallampati: II  TM Distance: >3 FB Neck ROM: full    Dental no notable dental hx.    Pulmonary neg pulmonary ROS,    Pulmonary exam normal breath sounds clear to auscultation       Cardiovascular Normal cardiovascular exam Rhythm:regular Rate:Normal  Reported history of ASD.. Will avoid air bubbles in IV set to limit risk of paradoxical embolism   Neuro/Psych  Headaches, PSYCHIATRIC DISORDERS Anxiety    GI/Hepatic negative GI ROS, Neg liver ROS,   Endo/Other  negative endocrine ROS  Renal/GU negative Renal ROS     Musculoskeletal Her mother denies cervical MSK issues like atlantoaxial instability.. She is able to extend her head and neck with no symptoms.. She had normal cervical Xrays to eval for subluxation in 2009, no changes since then   Abdominal   Peds  Hematology negative hematology ROS (+)   Anesthesia Other Findings Patient with developmental delay, autism and down syndrome, has hyperactive reaction to some IV medications but good response to oral midazolam per mom in past.. Oral midazolam with desired anxiolysis effect  Reproductive/Obstetrics negative OB ROS                          Anesthesia Physical Anesthesia Plan  ASA: III  Anesthesia Plan: General   Post-op Pain Management:    Induction: Intravenous  Airway Management Planned: Oral ETT  Additional Equipment:   Intra-op Plan:   Post-operative Plan:   Informed Consent: I have reviewed the patients History and Physical, chart, labs and discussed the procedure including the risks, benefits and alternatives for the proposed anesthesia with the  patient or authorized representative who has indicated his/her understanding and acceptance.   Dental Advisory Given  Plan Discussed with: Anesthesiologist, CRNA and Surgeon  Anesthesia Plan Comments: (Rec size 6.5 ETT, oral midazolam to be able to check IV and hook up fluids)        Anesthesia Quick Evaluation

## 2015-10-19 NOTE — Progress Notes (Signed)
Subjective: Patient seen and examined this morning on rounds.  Mother is present in the room.  Patient is sleeping quietly, occasionally stirring in the bed.  Mother is concerned that Haldol and Benadryl have been ineffective and may be causing a paradoxical reaction with her daughter and would like to avoid them but she also would like for her to be able to sleep.  Objective: Vital signs in last 24 hours: Filed Vitals:   10/17/15 1746 10/17/15 1830 10/18/15 0628 10/18/15 2229  BP: 111/70 106/64 118/73 111/69  Pulse: 113 110 136 131  Temp:   97.4 F (36.3 C) 99.5 F (37.5 C)  TempSrc:   Axillary Axillary  Resp: 22 20 18 17   SpO2: 100% 98% 97% 99%   Weight change:   Intake/Output Summary (Last 24 hours) at 10/19/15 1113 Last data filed at 10/18/15 2229  Gross per 24 hour  Intake    240 ml  Output      0 ml  Net    240 ml   General: lying in bed, sleeping, comfortable appearing HEENT: [place]/AT Cardiac: mildly tachycardic, no murmurs appreciated Abdomen: soft, non-distended, bowel sounds present Extremities: soft mittens on left hand Neuro: sleeping  Lab Results: Basic Metabolic Panel:  Recent Labs Lab 10/18/15 0625  NA 139  K 4.9  CL 107  CO2 24  GLUCOSE 79  BUN 9  CREATININE 0.66  CALCIUM 9.4   Liver Function Tests:  Recent Labs Lab 10/18/15 0625  AST 31  ALT 17  ALKPHOS 51  BILITOT 0.9  PROT 6.5  ALBUMIN 3.3*   CBC:  Recent Labs Lab 10/18/15 0625  WBC 7.5  HGB 13.0  HCT 39.4  MCV 96.8  PLT 373   CBG:  Recent Labs Lab 10/18/15 0959  GLUCAP 77   Thyroid Function Tests:  Recent Labs Lab 10/18/15 0625  TSH 6.537*  FREET4 0.74   Anemia Panel:  Recent Labs Lab 10/18/15 0625  VITAMINB12 217  FOLATE 30.3   Micro Results: No results found for this or any previous visit (from the past 240 hour(s)). Studies/Results: No results found. Medications: I have reviewed the patient's current medications. Scheduled Meds: . acetaminophen   1,000 mg Oral Q8H  . enoxaparin (LOVENOX) injection  40 mg Subcutaneous Q24H  . ramelteon  8 mg Oral QHS   Continuous Infusions:  PRN Meds:.LORazepam Assessment/Plan: Active Problems:   Headache   Cephalalgia   Autism   Down's syndrome  Headaches: mother states she believes patient is still having headaches as evidenced by daughter holding her head in her hands.    - MRI, MRA, MRV pending under anesthesia to rule out venous thrombosis or other underlying lesions.  Scheduled for today around 12pm.  Differential also includes migraine or tension-type headaches.  No fevers or WBC to suggest infection currently but will check a urinalysis for completeness - will discuss results with neurology after imaging done - appreciate neurology input - will continue Ativan 1mg  PO as needed Q6H for anxiety and agitation - Tylenol 1gm Q8H - will add Ramelteon 8mg  QHS scheduled - will try to avoid Haldol and Benadryl although she may have just received a non-therapeutic dose of Haldol on night of admission - can consider other options such as Risperidone or Seroquel if needed overnight  Chronic constipation: reported on admission but mother reports several bowel movements yesterday - discontinue Miralax prn   DVT ppx: Lovenox  Diet: NPO for MRI, otherwise Regular  Code: Full   Dispo:  pending MRI results  The patient does have a current PCP Eartha Inch(Michael C Badger, MD) and [facility] after discharge.  The patient does not have transportation limitations that hinder transportation to clinic appointments.  .Services Needed at time of discharge: Y = Yes, Blank = No PT:   OT:   RN:   Equipment:   Other:       Gwynn BurlyAndrew Donesha Wallander, DO 10/19/2015, 11:13 AM

## 2015-10-19 NOTE — Progress Notes (Signed)
Mother is refusing medication and vitals this morning stating that the patient is sleep. Will continue to monitor

## 2015-10-19 NOTE — Transfer of Care (Signed)
Immediate Anesthesia Transfer of Care Note  Patient: Ariana Shepherd  Procedure(s) Performed: Procedure(s) with comments: MRI OF BRAIN WITH AND WITHOUT CONTRAST, MRA OF HEAD WITHOUT, MRV OF HEAD WITHOUT (N/A) - DR. Harlon DittyWALLACE/MRI  Patient Location: PACU  Anesthesia Type:General  Level of Consciousness: awake, alert , oriented and patient cooperative  Airway & Oxygen Therapy: Patient Spontanous Breathing  Post-op Assessment: Report given to RN and Post -op Vital signs reviewed and stable  Post vital signs: Reviewed and stable  Last Vitals:  Filed Vitals:   10/18/15 2229 10/19/15 1617  BP: 111/69   Pulse: 131   Temp: 37.5 C 36.4 C  Resp: 17     Last Pain:  Filed Vitals:   10/19/15 1618  PainSc: Asleep         Complications: No apparent anesthesia complications

## 2015-10-19 NOTE — H&P (Signed)
History and Physical Exam reviewed; patient is OK for planned anesthetic and procedure.  

## 2015-10-19 NOTE — Progress Notes (Signed)
Initial Nutrition Assessment  DOCUMENTATION CODES:   Not applicable  INTERVENTION:   -RD will follow for diet advancement and supplement diet as appropriate  NUTRITION DIAGNOSIS:   Inadequate oral intake related to inability to eat as evidenced by NPO status.  GOAL:   Patient will meet greater than or equal to 90% of their needs  MONITOR:   Diet advancement, Labs, Weight trends, Skin, I & O's  REASON FOR ASSESSMENT:   Malnutrition Screening Tool    ASSESSMENT:   Patient is a 25 year old female with a past medical history of autism, Down syndrome, apraxia of speech, sensory integration disorder being brought into the hospital by her mother who was concerned that patient has been complaining of headaches for the past 1 year which have been getting progressively worse. Mother states whenever patient gets a headache she "rocks her head and screams." and for the past 3 days patient has been getting headaches more frequently.  Pt admitted with headache.   Hx obtained from chart, due to pt's agitation and refusal of most cares. Staff reports that pt has been very agitated since admission and has been refusing most care. Unable to complete Nutrition-Focused physical exam at this time.   Per H&P, pt has experienced a functional decline over the past year since headaches started. Pt is no longer able to feed herself.   Reviewed notes from Care Everywhere for pt's ht and weight.   Pt is currently NPO. She is scheduled for MRI/MRA/MRV with anesthesia today.   No intake data currently recorded. Suspect intake may be less during hospitalization vs PTA, due to unfamiliar environment. Previously receiving a regular diet.   Labs reviewed.   Diet Order:  Diet NPO time specified  Skin:  Reviewed, no issues  Last BM:  10/18/15  Height:   Ht Readings from Last 1 Encounters:  10/19/15 4' 9.72" (1.466 m)    Weight:   Wt Readings from Last 1 Encounters:  10/19/15 138 lb (62.596 kg)     Ideal Body Weight:  44.1 kg  BMI:  Body mass index is 29.13 kg/(m^2).  Estimated Nutritional Needs:   Kcal:  1500-1700  Protein:  75-90 grams  Fluid:  1.5-1.7 L  EDUCATION NEEDS:   No education needs identified at this time  Kynslee Baham A. Mayford KnifeWilliams, RD, LDN, CDE Pager: [phone] After hours Pager: [phone]

## 2015-10-19 NOTE — Progress Notes (Signed)
Went to check on patient at approximately 8 pm. Patient was sitting in the bathroom with her mother. She appeared calm and in no acute distress. Mother was concerned that the patient will not be able to sleep tonight. States medications given to her previously including Haldol for agitation and Ativan for anxiety did not help. She believes patient might still be having a headache.   Physical exam deferred at this time due to patient anxiety/ lack of cooperation    -Suggested sleep hygiene practices such as turning lights and TV off -One time dose of Compazine IV 10 mg, Benadryl  IV 12.5 mg, and Toradol IV 15 mg for headache -Ramelteon 8 mg once for sleep  -CTM

## 2015-10-20 ENCOUNTER — Encounter (HOSPITAL_COMMUNITY): Payer: Self-pay | Admitting: Radiology

## 2015-10-20 DIAGNOSIS — Q909 Down syndrome, unspecified: Secondary | ICD-10-CM | POA: Diagnosis not present

## 2015-10-20 DIAGNOSIS — N390 Urinary tract infection, site not specified: Secondary | ICD-10-CM

## 2015-10-20 DIAGNOSIS — F840 Autistic disorder: Secondary | ICD-10-CM | POA: Diagnosis not present

## 2015-10-20 DIAGNOSIS — R51 Headache: Secondary | ICD-10-CM | POA: Diagnosis not present

## 2015-10-20 MED ORDER — ACETAMINOPHEN 325 MG PO TABS: 650.0000 mg | ORAL_TABLET | Freq: Four times a day (QID) | ORAL | Status: DC

## 2015-10-20 MED ORDER — ACETAMINOPHEN 325 MG PO TABS: 650.0000 mg | ORAL_TABLET | Freq: Four times a day (QID) | ORAL | Status: DC | PRN

## 2015-10-20 MED ORDER — TOPIRAMATE 25 MG PO TABS: ORAL_TABLET | ORAL | Status: AC

## 2015-10-20 MED ORDER — KETOROLAC TROMETHAMINE 10 MG PO TABS
10.0000 mg | ORAL_TABLET | Freq: Once | ORAL | Status: AC
  Administered 2015-10-20: 10 mg via ORAL
  Filled 2015-10-20: qty 1

## 2015-10-20 MED ORDER — NITROFURANTOIN MONOHYD MACRO 100 MG PO CAPS: 100.0000 mg | ORAL_CAPSULE | Freq: Two times a day (BID) | ORAL | Status: AC

## 2015-10-20 MED ORDER — CLONAZEPAM 0.5 MG PO TABS
0.2500 mg | ORAL_TABLET | Freq: Once | ORAL | Status: AC
  Administered 2015-10-20: 0.25 mg via ORAL
  Filled 2015-10-20: qty 1

## 2015-10-20 MED ORDER — CARBAMIDE PEROXIDE 6.5 % OT SOLN: 5.0000 [drp] | Freq: Two times a day (BID) | OTIC | Status: AC

## 2015-10-20 MED ORDER — NITROFURANTOIN MONOHYD MACRO 100 MG PO CAPS
100.0000 mg | ORAL_CAPSULE | Freq: Two times a day (BID) | ORAL | Status: DC
  Administered 2015-10-20: 100 mg via ORAL
  Filled 2015-10-20 (×3): qty 1

## 2015-10-20 MED FILL — Midazolam HCl Inj 2 MG/2ML (Base Equivalent): INTRAMUSCULAR | Qty: 2 | Status: AC

## 2015-10-20 MED FILL — Propofol IV Emul 200 MG/20ML (10 MG/ML): INTRAVENOUS | Qty: 20 | Status: AC

## 2015-10-20 MED FILL — Ephedrine Sulfate Inj 50 MG/ML: INTRAMUSCULAR | Qty: 1 | Status: AC

## 2015-10-20 MED FILL — Phenylephrine-NaCl Pref Syringe 0.2 MG/5ML-0.9% (40 MCG/ML): INTRAVENOUS | Qty: 15 | Status: AC

## 2015-10-20 MED FILL — Fentanyl Citrate Preservative Free (PF) Inj 100 MCG/2ML: INTRAMUSCULAR | Qty: 2 | Status: AC

## 2015-10-20 NOTE — Progress Notes (Signed)
NURSING PROGRESS NOTE  Ariana Shepherd [id number] Discharge Data: 10/20/2015 3:35 PM Attending Provider: Tyson Aliasuncan Thomas Vincent, MD VQQ:VZDGLO,VFIEPPIPCP:BADGER,MICHAEL C, MD   Ariana Shepherd to be D/C'd Home per MD order.    All IV's will be discontinued and monitored for bleeding.  All belongings will be returned to patient for patient to take home.  Last Documented Vital Signs:  Blood pressure 111/58, pulse 119, temperature 99.1 F (37.3 C), temperature source Oral, resp. rate 18, height 4' 9.72" (1.466 m), weight 62.596 kg (138 lb), last menstrual period 10/04/2015, SpO2 100 %.  Ariana RearLonnie Jiselle Sheu, MSN, RN, [employer] EnergyCMSRN

## 2015-10-20 NOTE — Progress Notes (Signed)
Short Note:  All imagining reviewed:  MRI/MRA and MRV all negative and rule out any secondary causes of headache  All labs have been within normal range  This is a primary headache syndrome vs possible behavioral/cognitive decline which can be seen with down syndrome patients in there 20's.  Would recommend potentially starting topamax which can be used to treat headaches and be used as a mood stabilizer  - Can start with  Week 1- 25mg  PO qhs Week 2- 25mg  BID Week 3- 25mg  PO in the morning and 50mg  qhs week4 - 50 mg BID  Please call back with any questions

## 2015-10-20 NOTE — Progress Notes (Signed)
Subjective: Patient seen and examined this morning on rounds.  Mother is present in the room.  Patient is sleeping quietly.  Had MRI done yesterday.  Unfortunately, patient had another restless night trying to sleep.  Mother reports that her daughter has been having to go more frequently to the bathroom and is concerned that she has a UTI.  Objective: Vital signs in last 24 hours: Filed Vitals:   10/19/15 1640 10/19/15 1643 10/19/15 2206 10/20/15 0656  BP: 92/56  109/64 96/64  Pulse: 108 113 108 110  Temp:  97.9 F (36.6 C) 97.5 F (36.4 C) 97.8 F (36.6 C)  TempSrc:    Oral  Resp: Height:      Weight:      SpO2: 96% 99% 96% 99%   Weight change:   Intake/Output Summary (Last 24 hours) at 10/20/15 1009 Last data filed at 10/19/15 2225  Gross per 24 hour  Intake    700 ml  Output     75 ml  Net    625 ml   General: lying in bed, sleeping, comfortable appearing HEENT: [place]/AT Neuro: sleeping  Lab Results: Basic Metabolic Panel:  Recent Labs Lab 10/18/15 0625  NA 139  K 4.9  CL 107  CO2 24  GLUCOSE 79  BUN 9  CREATININE 0.66  CALCIUM 9.4   Liver Function Tests:  Recent Labs Lab 10/18/15 0625  AST 31  ALT 17  ALKPHOS 51  BILITOT 0.9  PROT 6.5  ALBUMIN 3.3*   CBC:  Recent Labs Lab 10/18/15 0625  WBC 7.5  HGB 13.0  HCT 39.4  MCV 96.8  PLT 373   CBG:  Recent Labs Lab 10/18/15 0959  GLUCAP 77   Thyroid Function Tests:  Recent Labs Lab 10/18/15 0625  TSH 6.537*  FREET4 0.74   Anemia Panel:  Recent Labs Lab 10/18/15 0625  VITAMINB12 217  FOLATE 30.3   Micro Results: No results found for this or any previous visit (from the past 240 hour(s)). Studies/Results: Mr Shirlee Latch Wo Contrast  10/19/2015  CLINICAL DATA:  New onset headaches. EXAM: MRI HEAD WITHOUT AND WITH CONTRAST MRA HEAD WITHOUT CONTRAST MRV HEAD WITHOUT CONTRAST TECHNIQUE: Multiplanar, multiecho pulse sequences of the brain and surrounding structures were  obtained without and with intravenous contrast. Angiographic images of the head were obtained using MRA and MRV technique without contrast. CONTRAST:  13 mL MultiHance COMPARISON:  CT head without contrast 12/02/2014 FINDINGS: MRI HEAD FINDINGS No acute infarct, hemorrhage, or mass lesion is present. The ventricles are of normal size. No significant extraaxial fluid collection is present. No significant white matter disease is present. The internal auditory canals are within normal limits bilaterally. The brainstem and cerebellum are normal. Flow is present in the major intracranial arteries. The globes and orbits are intact. The paranasal sinuses are clear. The mastoid air cells are clear. The postcontrast images demonstrate no pathologic enhancement. MRA HEAD FINDINGS Internal auditory canals are within normal limits from the high cervical segments through the ICA termini bilaterally. The A1 and M1 segments are normal. The anterior communicating artery is patent. ACA and MCA branch vessels are within normal limits. The left vertebral artery is the dominant vessel. The PICA origins are visualized and normal bilaterally. The basilar artery is within normal limits. Both posterior cerebral arteries originate the basilar tip. The PCA branch vessels are within normal limits bilaterally. Small posterior communicating arteries are noted bilaterally. MRV HEAD FINDINGS The dural sinuses  are patent. The right transverse sinus is dominant. The straight sinus and deep cerebral veins are intact. Cortical veins are within normal limits. IMPRESSION: 1. Normal MRI appearance of the brain. 2. Normal MRA circle Willis without significant proximal stenosis, aneurysm, or branch vessel occlusion. 3. Normal MR venogram. 4. No acute or focal lesion to explain the patient's symptoms. Electronically Signed   By: Marin Robertshristopher  Mattern M.D.   On: 10/19/2015 16:36   Mr Laqueta JeanBrain W ZOWo Contrast  10/19/2015  CLINICAL DATA:  New onset headaches.  EXAM: MRI HEAD WITHOUT AND WITH CONTRAST MRA HEAD WITHOUT CONTRAST MRV HEAD WITHOUT CONTRAST TECHNIQUE: Multiplanar, multiecho pulse sequences of the brain and surrounding structures were obtained without and with intravenous contrast. Angiographic images of the head were obtained using MRA and MRV technique without contrast. CONTRAST:  13 mL MultiHance COMPARISON:  CT head without contrast 12/02/2014 FINDINGS: MRI HEAD FINDINGS No acute infarct, hemorrhage, or mass lesion is present. The ventricles are of normal size. No significant extraaxial fluid collection is present. No significant white matter disease is present. The internal auditory canals are within normal limits bilaterally. The brainstem and cerebellum are normal. Flow is present in the major intracranial arteries. The globes and orbits are intact. The paranasal sinuses are clear. The mastoid air cells are clear. The postcontrast images demonstrate no pathologic enhancement. MRA HEAD FINDINGS Internal auditory canals are within normal limits from the high cervical segments through the ICA termini bilaterally. The A1 and M1 segments are normal. The anterior communicating artery is patent. ACA and MCA branch vessels are within normal limits. The left vertebral artery is the dominant vessel. The PICA origins are visualized and normal bilaterally. The basilar artery is within normal limits. Both posterior cerebral arteries originate the basilar tip. The PCA branch vessels are within normal limits bilaterally. Small posterior communicating arteries are noted bilaterally. MRV HEAD FINDINGS The dural sinuses are patent. The right transverse sinus is dominant. The straight sinus and deep cerebral veins are intact. Cortical veins are within normal limits. IMPRESSION: 1. Normal MRI appearance of the brain. 2. Normal MRA circle Willis without significant proximal stenosis, aneurysm, or branch vessel occlusion. 3. Normal MR venogram. 4. No acute or focal lesion to  explain the patient's symptoms. Electronically Signed   By: Marin Robertshristopher  Mattern M.D.   On: 10/19/2015 16:36   Mr Mrv Head Wo Cm  10/19/2015  CLINICAL DATA:  New onset headaches. EXAM: MRI HEAD WITHOUT AND WITH CONTRAST MRA HEAD WITHOUT CONTRAST MRV HEAD WITHOUT CONTRAST TECHNIQUE: Multiplanar, multiecho pulse sequences of the brain and surrounding structures were obtained without and with intravenous contrast. Angiographic images of the head were obtained using MRA and MRV technique without contrast. CONTRAST:  13 mL MultiHance COMPARISON:  CT head without contrast 12/02/2014 FINDINGS: MRI HEAD FINDINGS No acute infarct, hemorrhage, or mass lesion is present. The ventricles are of normal size. No significant extraaxial fluid collection is present. No significant white matter disease is present. The internal auditory canals are within normal limits bilaterally. The brainstem and cerebellum are normal. Flow is present in the major intracranial arteries. The globes and orbits are intact. The paranasal sinuses are clear. The mastoid air cells are clear. The postcontrast images demonstrate no pathologic enhancement. MRA HEAD FINDINGS Internal auditory canals are within normal limits from the high cervical segments through the ICA termini bilaterally. The A1 and M1 segments are normal. The anterior communicating artery is patent. ACA and MCA branch vessels are within normal limits. The left vertebral  artery is the dominant vessel. The PICA origins are visualized and normal bilaterally. The basilar artery is within normal limits. Both posterior cerebral arteries originate the basilar tip. The PCA branch vessels are within normal limits bilaterally. Small posterior communicating arteries are noted bilaterally. MRV HEAD FINDINGS The dural sinuses are patent. The right transverse sinus is dominant. The straight sinus and deep cerebral veins are intact. Cortical veins are within normal limits. IMPRESSION: 1. Normal MRI  appearance of the brain. 2. Normal MRA circle Willis without significant proximal stenosis, aneurysm, or branch vessel occlusion. 3. Normal MR venogram. 4. No acute or focal lesion to explain the patient's symptoms. Electronically Signed   By: Marin Robertshristopher  Mattern M.D.   On: 10/19/2015 16:36   Medications: I have reviewed the patient's current medications. Scheduled Meds: . enoxaparin (LOVENOX) injection  40 mg Subcutaneous Q24H  . nitrofurantoin (macrocrystal-monohydrate)  100 mg Oral Q12H  . ramelteon  8 mg Oral QHS   Continuous Infusions:  PRN Meds:.LORazepam Assessment/Plan: Active Problems:   Headache   Cephalalgia   Autism   Down's syndrome  Headaches: MRI/MRA, MRV were done yesterday to rule out a secondary cause of her headaches.  No mass, thrombus, or other abnormality was seen to explain patient symptoms.  Mother description of symptoms do not seem consistent with migraine although potential for this or tension-type headaches remains a possibility.  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  Neurology recommended potentially starting topiramate for headaches and mood.  Doubt infectious etiology of headaches as no fevers, no WBC, no findings suggestive of meningitis.  Potential exists for encephalitis but feel this would be unlikely and only way to definitively diagnose would be via lumbar puncture which would most likely require sedation and mother would like to try to avoid this - will discuss with patient and mother regarding starting Topamax or having this discussion with PCP at follow up. - follow up with PCP - discharge today  ? UTI:  Urinalysis with few bacteria, 6-30 WBC, trace leukocytes, 0-5 squamous epithelial cells.  Negative nitrites.  Patient's mother reports daughter is having symptoms of frequency and discomfort.  It is not  entirely clear if patient is symptomatic or behavioral.  - will treat with Macrobid for uncomplicated UTI - urine culture is pending  Chronic constipation: reported on admission but mother reports several bowel movements yesterday - discontinue Miralax prn   DVT ppx: Lovenox  Diet: Regular  Code: Full   Dispo: home today  The patient does have a current PCP Eartha Inch(Michael C Badger, MD) and [facility] after discharge.  The patient does not have transportation limitations that hinder transportation to clinic appointments.  .Services Needed at time of discharge: Y = Yes, Blank = No PT:   OT:   RN:   Equipment:   Other:       Gwynn BurlyAndrew Vikkie Goeden, DO 10/20/2015, 10:09 AM

## 2015-10-20 NOTE — Discharge Instructions (Signed)
Dear Ms. Ariana Shepherd,  It was a pleasure taking care of your daughter in the hospital.  I am sorry you are dealing with these headaches.  The MRI we did yesterday was able to rule out anything very serious that may be causing the headaches.  Unfortunately, we are not entirely sure what may be causing them and would recommend continuing to follow up with your Primary Family Medicine Doctor who knows you well.  We will start a new medication called Topamax to possibly help with headache prevention.  You mentioned ear wax, so you can also use Debrox to help with removal of this.  Otherwise we will treat for a urinary tract infection for 5 days with a medication called Macrobid.  You may also use AZO as needed but be cautious as one of the side effects noted is headaches.  You may also contact either of the Neurology groups in town to follow up post-hospitalization.  Their office contact information is listed.  For the Topamax, please follow the following dose titration: Week 1- 25mg  at bedtime Week 2- 25mg  twice daily Week 3- 25mg  in the morning and 50mg  at bedtime week4 - 50 mg twice daily  Take care.

## 2015-10-20 NOTE — Progress Notes (Signed)
Mother refused patient assessment for patient. Asked to let her sleep. Patient breathing at the time. Will attempt again later.

## 2015-10-20 NOTE — Discharge Summary (Signed)
Name: Ariana Shepherd MRN: [id number] DOB: 07/31/1990 25 y.o. PCP: Eartha InchMichael C Badger, MD  Date of Admission: 10/17/2015 12:14 PM Date of Discharge: 10/20/2015 Attending Physician: Tyson Aliasuncan Thomas Vincent, MD  Discharge Diagnosis:  Active Problems:   Headache   Cephalalgia   Autism   Down's syndrome   Discharge Medications:   Medication List    STOP taking these medications        OVER THE COUNTER MEDICATION      TAKE these medications        acetaminophen 500 MG tablet  Commonly known as:  TYLENOL  Take 500 mg by mouth every 6 (six) hours as needed for mild pain, moderate pain or headache.     carbamide peroxide 6.5 % otic solution  Commonly known as:  DEBROX  Place 5 drops into both ears 2 (two) times daily. For up to 4 days.     ibuprofen 200 MG tablet  Commonly known as:  ADVIL,MOTRIN  Take 200 mg by mouth every 6 (six) hours as needed for headache or moderate pain.     JOLESSA 0.15-0.03 MG tablet  Generic drug:  levonorgestrel-ethinyl estradiol  Take 1 tablet by mouth daily.     MAGNESIUM PO  Take 2 capsules by mouth daily.     nitrofurantoin (macrocrystal-monohydrate) 100 MG capsule  Commonly known as:  MACROBID  Take 1 capsule (100 mg total) by mouth every 12 (twelve) hours.     OMEGA 3 PO  Take 1 capsule by mouth daily.     Probiotic Caps  Take 1 capsule by mouth daily.     topiramate 25 MG tablet  Commonly known as:  TOPAMAX  Week 1- 25mg  by mouth at bedtime. Week 2- 25mg  twice daily. Week 3- 25mg  in the morning and 50mg  at bedtime. Week 4 - 50 mg twice daily     TURMERIC PO  Take 1 capsule by mouth daily.        Disposition and follow-up:   [name] [facility] St[facility].    1.  At the hospital follow up visit please address:  - has she had resolution of UTI and completed course of Macrobid? - do her headaches seem better with starting Topamax? - has she contacted neurology to follow  up?  2.  Labs / imaging needed at time of follow-up: none  3.  Pending labs/ test needing follow-up: none  Follow-up Appointments:     Follow-up Information    Call [facility].   Contact information:   [address]     [address] [place] [phone]      Call [facility].   Contact information:   [address] [place] [id number] [phone]      Schedule an appointment as soon as possible for a visit with Eartha InchBADGER,MICHAEL C, MD.   Specialty:  Family Medicine   Why:  for hospital follow up.   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  MRI/MRA and MRV were completed under sedation and they did not reveal any mass, thrombus,  or other abnormality to explain symptoms.  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  Unlikely that she has been experiencing migraines as the primary source as episodes were brief (5-10 minutes) and she soon returns to her baseline.  We began a therapeutic trial of Topamax as recommended by neurology.  Patient will follow up with her PCP and consider possible adolescent psychiatry referral for further medication management.  ? UTI: Urinalysis with few bacteria, 6-30 WBC, trace leukocytes, 0-5 squamous epithelial cells. Negative nitrites. Patient's mother reports daughter is having symptoms of frequency and discomfort. It is not entirely clear if patient is symptomatic or behavioral.  Urine culture with multiple species. We will treat  with Macrobid for uncomplicated UTI.   Discharge Vitals:   BP 96/64 mmHg  Pulse 110  Temp(Src) 97.8 F (36.6 C) (Oral)  Resp 15  Ht 4' 9.72" (1.466 m)  Wt 138 lb (62.596 kg)  BMI 29.13 kg/m2  SpO2 99%  LMP 10/04/2015  Pertinent Labs, Studies, and Procedures:  1. Normal MRI appearance of the brain. 2. Normal MRA circle Willis without significant proximal stenosis, aneurysm, or branch vessel occlusion. 3. Normal MR venogram. 4. No acute or focal lesion to explain the patient's symptoms.  Discharge Instructions:  It was a pleasure taking care of your daughter in the hospital.  I am sorry you are dealing with these headaches.  The MRI we did yesterday was able to rule out anything very serious that may be causing the headaches.  Unfortunately, we are not entirely sure what may be causing them and would recommend continuing to follow up with your Primary Family Medicine Doctor who knows you well.  We will start a new medication called Topamax to possibly help with headache prevention.  You mentioned ear wax, so you can also use Debrox to help with removal of this.  Otherwise we will treat for a urinary tract infection for 5 days with a medication called Macrobid.  You may also use AZO as needed but be cautious as one of the side effects noted is headaches.  You may also contact either of the Neurology groups in town to follow up post-hospitalization.  Their office contact information is listed.  For the Topamax, please follow the following dose titration: Week 1- 25mg  at bedtime Week 2- 25mg  twice daily Week 3- 25mg  in the morning and 50mg  at bedtime week4 - 50 mg twice daily   Signed: Gwynn BurlyAndrew Carrisa Keller, DO 10/20/2015, 11:01 AM   Pager: [phone]

## 2015-10-20 NOTE — Progress Notes (Signed)
Went to talk to patient's mother at bedside. Patient's mother is frustrated. She thinks her daughter has a UTI because she has been having increased urinary frequency. She also reports her daughter is having vaginal/urethral discomfort and is requesting AZO (phenazopyridine). She said that her daughter had provided a urine sample for UA earlier this morning and this was lost by the nursing staff. She is also distressed by her daughter not being able to sleep while she is here in the hospital.  Patient is sitting up in bed and appears mildly anxious but is not combative or agitated. She indicated that she has to urinate again.  UA with few bacteria, 6-30 WBC, and trace leukocytes but 0-5 squamous epithelial cells. Negative nitrites.  UTI: It is unclear if the increased urinary frequency is from symptomatic bacteruria or if it is behavioral. She had increased bowel movements the first night she was here, and her mother was using washcloths to clean her genital area - irritation from the washcloths could be causing the discomfort that her mother is referring to.  -Macrobid already initiated.  -Urine culture pending -No phenazopyridine as one of the side effects is headache and dizziness, which is what brought her into the hospital. -Mother reports Klonopin worked for her daughter earlier [place] [employer]. Will give an additional dose of 0.25mg  once for anxiety.

## 2015-10-21 LAB — URINE CULTURE

## 2015-10-28 NOTE — Anesthesia Postprocedure Evaluation (Signed)
Anesthesia Post Note  Patient: Ariana Shepherd  Procedure(s) Performed: Procedure(s) (LRB): MRI OF BRAIN WITH AND WITHOUT CONTRAST, MRA OF HEAD WITHOUT, MRV OF HEAD WITHOUT (N/A)  Patient location during evaluation: PACU Anesthesia Type: General Level of consciousness: awake and alert Pain management: pain level controlled Vital Signs Assessment: post-procedure vital signs reviewed and stable Respiratory status: spontaneous breathing, nonlabored ventilation, respiratory function stable and patient connected to nasal cannula oxygen Cardiovascular status: blood pressure returned to baseline and stable Postop Assessment: no signs of nausea or vomiting Anesthetic complications: no              Reino KentJudd, Icela Glymph J

## 2017-12-18 IMAGING — MR MR MRV HEAD W/O CM
12 of 17 series · 25 of 48 positions shown · IV contrast (multihance)
Comparison: CT head without contrast 12/02/2014

CLINICAL DATA: New onset headaches.

EXAM:
MRI HEAD WITHOUT AND WITH CONTRAST
MRA HEAD WITHOUT CONTRAST
MRV HEAD WITHOUT CONTRAST
TECHNIQUE: Multiplanar, multiecho pulse sequences of the brain and surrounding
structures were obtained without and with intravenous contrast.
Angiographic images of the head were obtained using MRA and MRV
technique without contrast.
CONTRAST:  13 mL MultiHance

[Series 2: FLAIR · sagittal · 5.0mm · 0.43mm/px · 1 of 21 slices shown (1 of 2)]
[im 1/21]
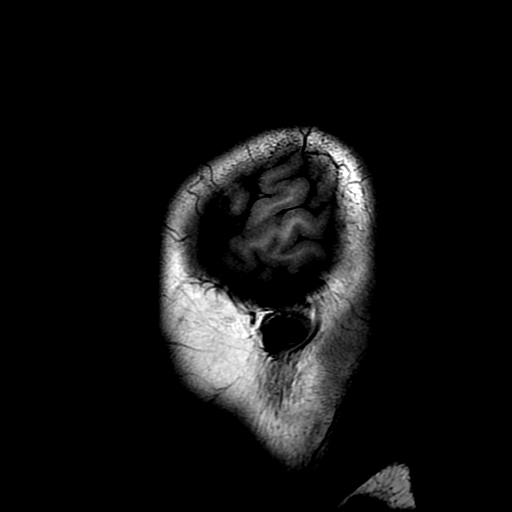

[Series 4: DWI · axial · 3.0mm · 0.94mm/px · z∈[-129,-10]mm · 3 of 84 slices shown (1 of 2)]
[im 1/84]
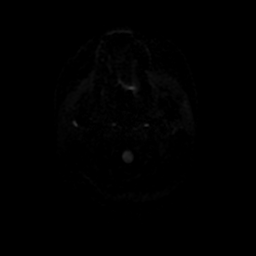
[im 42/84]
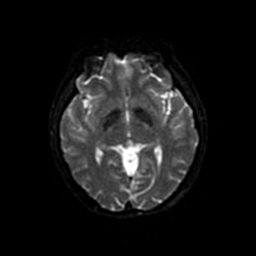
[im 84/84]
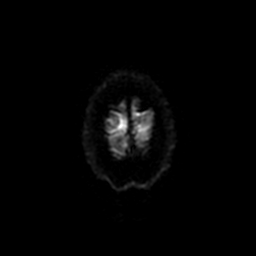

[Series 5: ax (id) 2 · axial · 1.0mm · 0.43mm/px · z∈[-121,-31]mm · 7 of 184 slices shown]
[im 1/184]
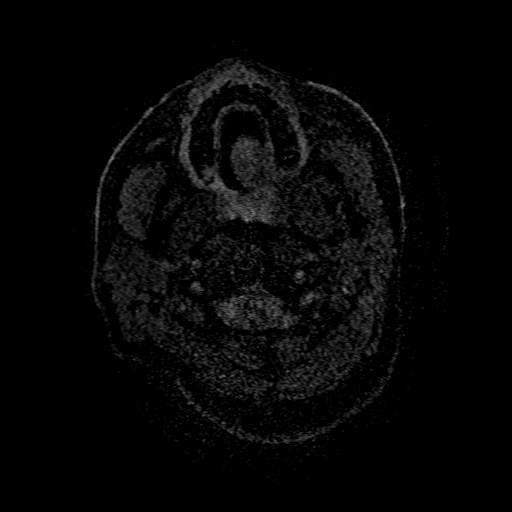
[im 31/184]
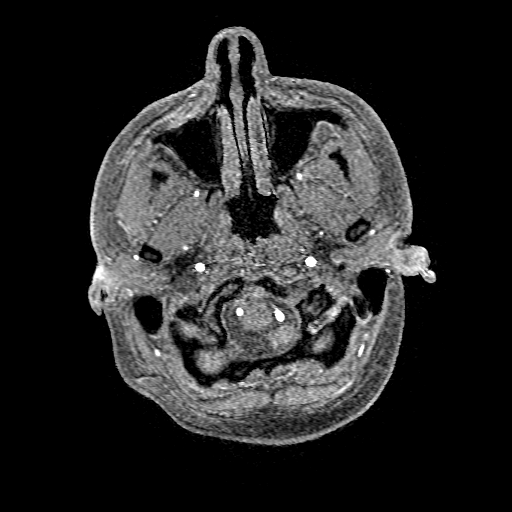
[im 62/184]
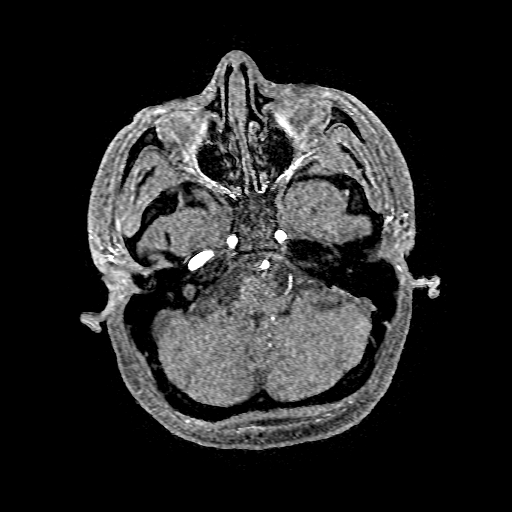
[im 92/184]
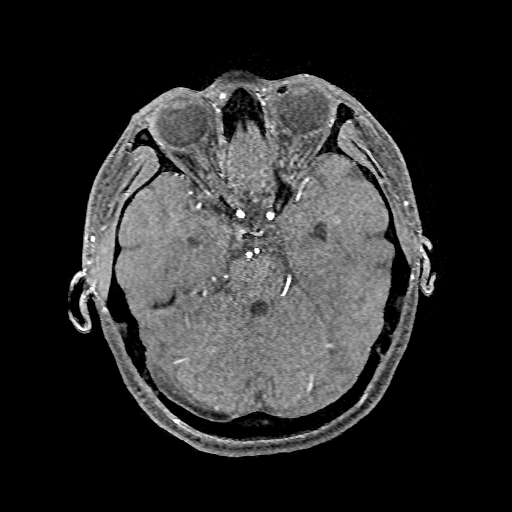
[im 123/184]
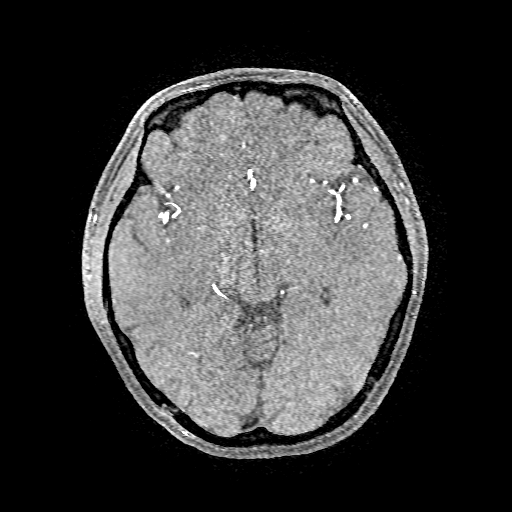
[im 153/184]
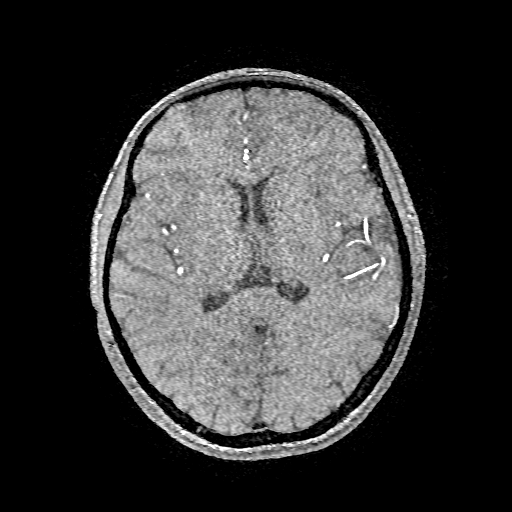
[im 184/184]
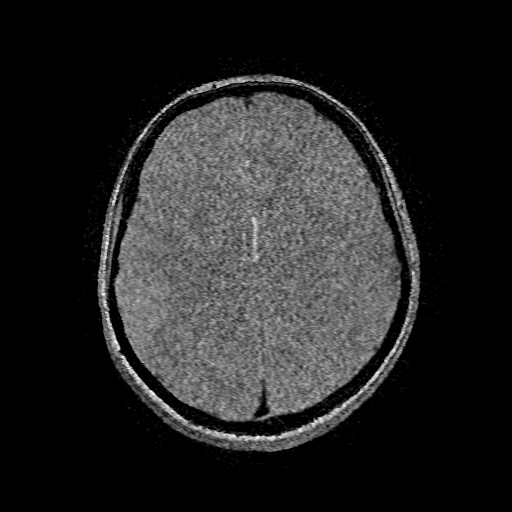

[Series 6: MRV · coronal · 1.5mm · 0.43mm/px · 3 of 106 slices shown]
[im 1/106]
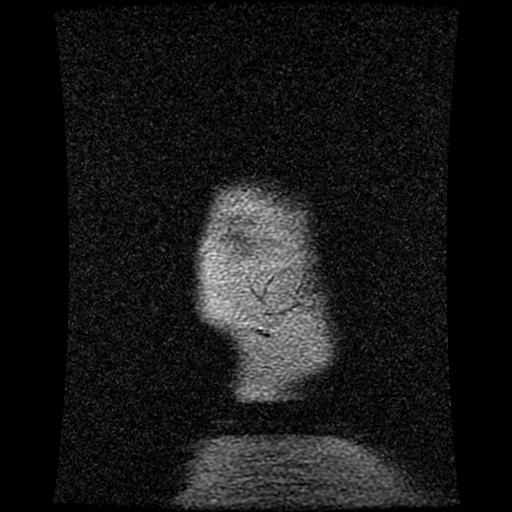
[im 36/106]
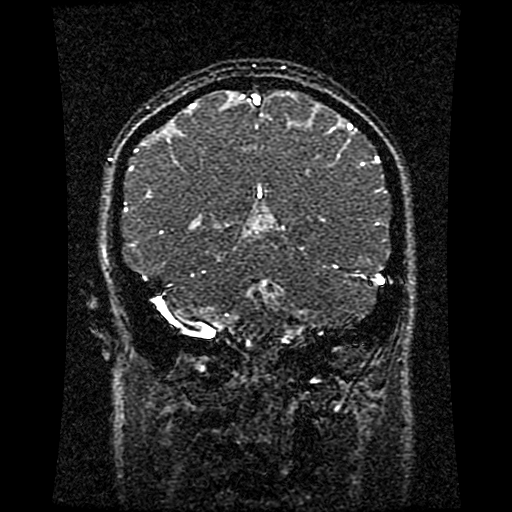
[im 71/106]
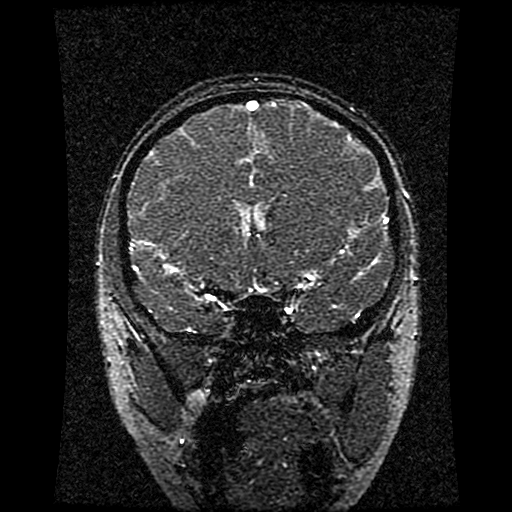

[Series 8: T2 · axial · 5.0mm · 0.47mm/px · 1 of 21 slices shown (1 of 2)]
[im 1/21]
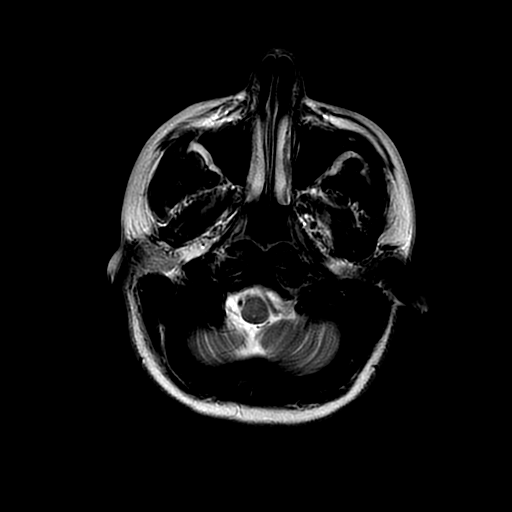

[Series 9: FLAIR · axial · 5.0mm · 0.47mm/px · 1 of 21 slices shown (2 of 2)]
[im 1/21]
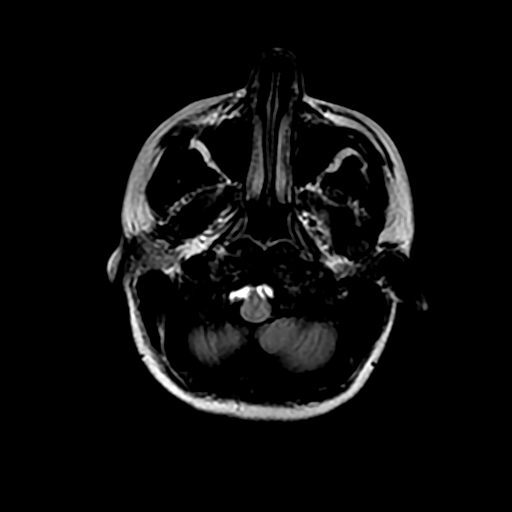

[Series 11: DWI · coronal · 4.0mm · 0.94mm/px · 3 of 60 slices shown (2 of 2)]
[im 1/60]
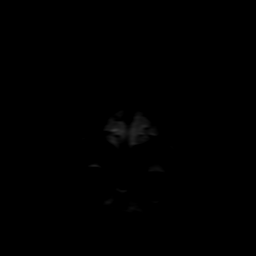
[im 30/60]
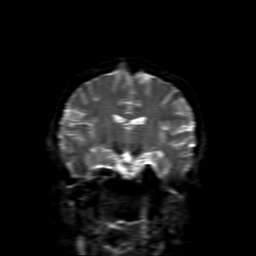
[im 60/60]
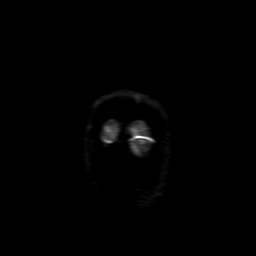

[Series 15: T2 · coronal · 5.0mm · 0.39mm/px · 1 of 25 slices shown (2 of 2)]
[im 1/25]
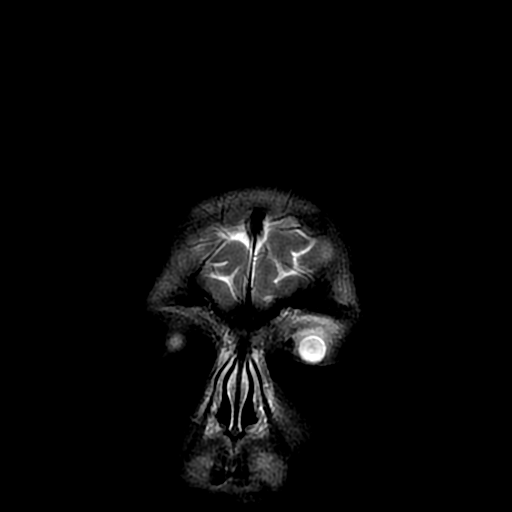

[Series 17: T1 · axial · 5.0mm · 0.47mm/px · 1 of 21 slices shown (1 of 2)]
[im 1/21]
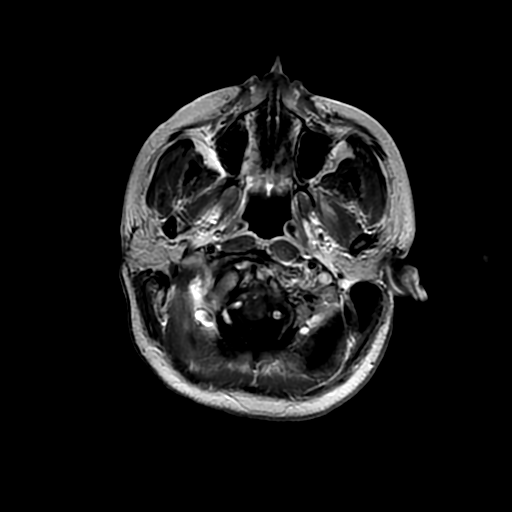

[Series 18: T1 · coronal · 5.0mm · 0.47mm/px · 1 of 25 slices shown (2 of 2)]
[im 1/25]
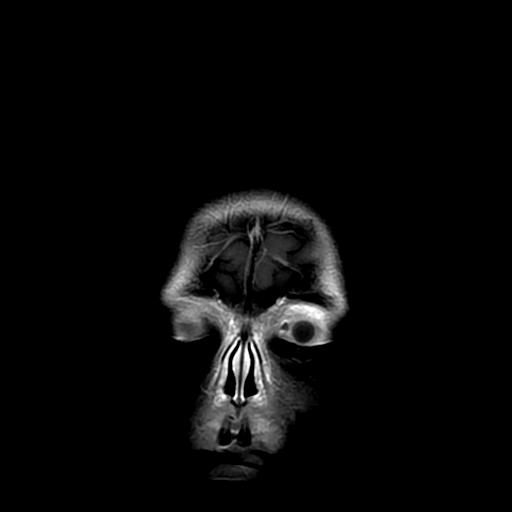

[Series 450: ADC · axial · 3.0mm · 0.94mm/px · z∈[-129,-10]mm · 2 of 42 slices shown (1 of 2)]
[im 1/42]
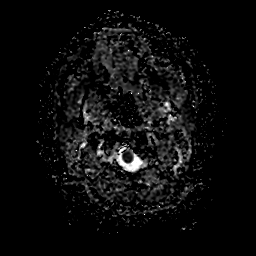
[im 42/42]
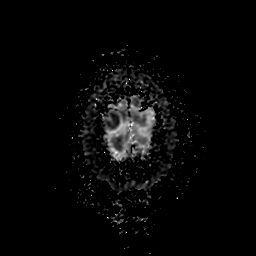

[Series 1150: ADC · coronal · 4.0mm · 0.94mm/px · 1 of 30 slices shown (2 of 2)]
[im 1/30]
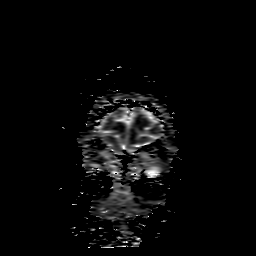

[25 of 48 positions shown; findings below may reference images not displayed]

FINDINGS: MRI HEAD FINDINGS

No acute infarct, hemorrhage, or mass lesion is present. The
ventricles are of normal size. No significant extraaxial fluid
collection is present.

No significant white matter disease is present. The internal
auditory canals are within normal limits bilaterally. The brainstem
and cerebellum are normal. Flow is present in the major intracranial
arteries. The globes and orbits are intact. The paranasal sinuses
are clear. The mastoid air cells are clear.

The postcontrast images demonstrate no pathologic enhancement.

MRA HEAD FINDINGS

Internal auditory canals are within normal limits from the high
cervical segments through the ICA termini bilaterally. The A1 and M1
segments are normal. The anterior communicating artery is patent.
ACA and MCA branch vessels are within normal limits.

The left vertebral artery is the dominant vessel. The PICA origins
are visualized and normal bilaterally. The basilar artery is within
normal limits. Both posterior cerebral arteries originate the
basilar tip. The PCA branch vessels are within normal limits
bilaterally. Small posterior communicating arteries are noted
bilaterally.

MRV HEAD FINDINGS

The dural sinuses are patent. The right transverse sinus is
dominant. The straight sinus and deep cerebral veins are intact.
Cortical veins are within normal limits.
IMPRESSION: 1. Normal MRI appearance of the brain.
2. Normal MRA circle Willis without significant proximal stenosis,
aneurysm, or branch vessel occlusion.
3. Normal MR venogram.
4. No acute or focal lesion to explain the patient's symptoms.
